# Patient Record
Sex: Female | Born: 1970 | Race: White | Hispanic: No | Marital: Married | Smoking: Former smoker
Health system: Southern US, Community
[De-identification: ages and names within clinical notes are randomized; demographics above are authoritative.]

## PROBLEM LIST (undated history)

## (undated) DIAGNOSIS — F419 Anxiety disorder, unspecified: Secondary | ICD-10-CM

## (undated) DIAGNOSIS — E119 Type 2 diabetes mellitus without complications: Secondary | ICD-10-CM

---

## 2006-06-20 ENCOUNTER — Emergency Department (HOSPITAL_COMMUNITY): Admission: EM | Admit: 2006-06-20 | Discharge: 2006-06-20 | Payer: Self-pay | Admitting: Emergency Medicine

## 2007-01-01 ENCOUNTER — Emergency Department (HOSPITAL_COMMUNITY): Admission: EM | Admit: 2007-01-01 | Discharge: 2007-01-01 | Payer: Self-pay | Admitting: Emergency Medicine

## 2008-07-15 ENCOUNTER — Emergency Department (HOSPITAL_COMMUNITY): Admission: EM | Admit: 2008-07-15 | Discharge: 2008-07-15 | Payer: Self-pay | Admitting: Emergency Medicine

## 2009-08-24 ENCOUNTER — Emergency Department (HOSPITAL_COMMUNITY): Admission: EM | Admit: 2009-08-24 | Discharge: 2009-08-24 | Payer: Self-pay | Admitting: Emergency Medicine

## 2009-08-29 ENCOUNTER — Ambulatory Visit (HOSPITAL_COMMUNITY): Admission: RE | Admit: 2009-08-29 | Discharge: 2009-08-29 | Payer: Self-pay | Admitting: Pediatrics

## 2010-06-28 ENCOUNTER — Emergency Department (HOSPITAL_COMMUNITY): Admission: EM | Admit: 2010-06-28 | Discharge: 2010-06-28 | Payer: Self-pay | Admitting: Emergency Medicine

## 2010-07-10 ENCOUNTER — Ambulatory Visit (HOSPITAL_COMMUNITY): Admission: RE | Admit: 2010-07-10 | Discharge: 2010-07-10 | Payer: Self-pay | Admitting: Orthopaedic Surgery

## 2010-07-10 ENCOUNTER — Encounter (HOSPITAL_COMMUNITY)
Admission: RE | Admit: 2010-07-10 | Discharge: 2010-08-09 | Payer: Self-pay | Source: Home / Self Care | Admitting: Orthopaedic Surgery

## 2011-07-24 ENCOUNTER — Encounter: Payer: Self-pay | Admitting: *Deleted

## 2011-07-24 ENCOUNTER — Emergency Department (HOSPITAL_COMMUNITY)
Admission: EM | Admit: 2011-07-24 | Discharge: 2011-07-24 | Disposition: A | Discharge status: Home / Self Care | Payer: Managed Care, Other (non HMO) | Attending: Emergency Medicine | Admitting: Emergency Medicine

## 2011-07-24 DIAGNOSIS — R0602 Shortness of breath: Secondary | ICD-10-CM | POA: Insufficient documentation

## 2011-07-24 DIAGNOSIS — R232 Flushing: Secondary | ICD-10-CM | POA: Insufficient documentation

## 2011-07-24 DIAGNOSIS — M549 Dorsalgia, unspecified: Secondary | ICD-10-CM | POA: Insufficient documentation

## 2011-07-24 DIAGNOSIS — F17200 Nicotine dependence, unspecified, uncomplicated: Secondary | ICD-10-CM | POA: Insufficient documentation

## 2011-07-24 DIAGNOSIS — R000 Tachycardia, unspecified: Secondary | ICD-10-CM | POA: Insufficient documentation

## 2011-07-24 LAB — URINALYSIS, ROUTINE W REFLEX MICROSCOPIC
Hgb urine dipstick: NEGATIVE
Protein, ur: NEGATIVE mg/dL
Specific Gravity, Urine: 1.025 (ref 1.005–1.030)
Urobilinogen, UA: 0.2 mg/dL (ref 0.0–1.0)

## 2011-07-24 MED ORDER — HYDROCODONE-ACETAMINOPHEN 7.5-325 MG PO TABS: 1.0000 | ORAL_TABLET | ORAL | Status: AC | PRN

## 2011-07-24 MED ORDER — HYDROCODONE-ACETAMINOPHEN 5-325 MG PO TABS
2.0000 | ORAL_TABLET | Freq: Once | ORAL | Status: AC
  Administered 2011-07-24: 2 via ORAL
  Filled 2011-07-24: qty 2

## 2011-07-24 MED ORDER — CYCLOBENZAPRINE HCL 10 MG PO TABS: ORAL_TABLET | ORAL | Status: DC

## 2011-07-24 MED ORDER — DEXAMETHASONE 6 MG PO TABS: ORAL_TABLET | ORAL | Status: AC

## 2011-07-24 MED ORDER — ONDANSETRON HCL 4 MG PO TABS
4.0000 mg | ORAL_TABLET | Freq: Once | ORAL | Status: AC
  Administered 2011-07-24: 4 mg via ORAL
  Filled 2011-07-24: qty 1

## 2011-07-24 MED ORDER — DIAZEPAM 5 MG PO TABS
10.0000 mg | ORAL_TABLET | Freq: Once | ORAL | Status: AC
  Administered 2011-07-24: 10 mg via ORAL
  Filled 2011-07-24: qty 2

## 2011-07-24 NOTE — ED Provider Notes (Signed)
Medical screening examination/treatment/procedure(s) were performed by non-physician practitioner and as supervising physician I was immediately available for consultation/collaboration.  Andjela Wickes R. Tarance Balan, MD 07/24/11 2356 

## 2011-07-24 NOTE — ED Provider Notes (Signed)
History     CSN: [id number] Arrival date & time: 07/24/2011  9:20 PM   First MD Initiated Contact with Patient 07/24/11 2130      Chief Complaint  Patient presents with  . Allergic Reaction    (Consider location/radiation/quality/duration/timing/severity/associated sxs/prior treatment) HPI Comments: Pt states she developed pain in the back going to the left hip/thigh and pubis. She felt she was having a UTI and was seen by her MD. UA was "clean", but pt was placed on macrodantin. After one pill pt states she felt short of breath and that her heart was racing. No rash. She did feel flushed. No swelling of face, tongue or lips, or hands or feet.  Patient is a 40 y.o. female presenting with allergic reaction. The history is provided by the patient.  Allergic Reaction The primary symptoms are  shortness of breath and palpitations. The primary symptoms do not include wheezing, cough, abdominal pain, nausea, vomiting, diarrhea, dizziness, rash, angioedema or urticaria. The current episode started 13 to 24 hours ago. The problem has not changed since onset. The shortness of breath began today. The shortness of breath developed suddenly. The patient's medical history does not include CHF, COPD, asthma or chronic lung disease.  The palpitations also occurred with shortness of breath. The palpitations did not occur with dizziness.   The onset of the reaction was associated with a new medication. Significant symptoms also include flushing. Significant symptoms that are not present include rhinorrhea or itching.    History reviewed. No pertinent past medical history.  Past Surgical History  Procedure Date  . Cesarean section     x 2    No family history on file.  History  Substance Use Topics  . Smoking status: Current Everyday Smoker    Types: Cigarettes  . Smokeless tobacco: Not on file  . Alcohol Use: No    OB History    Grav Para Term Preterm Abortions TAB SAB Ect Mult Living                 Review of Systems  HENT: Negative for rhinorrhea.   Respiratory: Positive for shortness of breath. Negative for cough and wheezing.   Cardiovascular: Positive for palpitations.  Gastrointestinal: Negative for nausea, vomiting, abdominal pain and diarrhea.  Skin: Positive for flushing. Negative for itching and rash.  Neurological: Negative for dizziness.    Allergies  Shellfish allergy and Aspartame and phenylalanine  Home Medications   Current Outpatient Rx  Name Route Sig Dispense Refill  . NITROFURANTOIN MACROCRYSTAL 100 MG PO CAPS Oral Take 100 mg by mouth 2 (two) times daily.        BP 111/68  Pulse 77  Temp(Src) 98.2 F (36.8 C) (Oral)  Resp 16  Ht 5\' 2"  (1.575 m)  Wt 152 lb (68.947 kg)  BMI 27.80 kg/m2  SpO2 99%  Physical Exam  Nursing note and vitals reviewed. Constitutional: She is oriented to person, place, and time. She appears well-developed and well-nourished.  Non-toxic appearance.  HENT:  Head: Normocephalic.  Right Ear: Tympanic membrane and external ear normal.  Left Ear: Tympanic membrane and external ear normal.       No face, lips, or tongue swelling.  Eyes: EOM and lids are normal. Pupils are equal, round, and reactive to light.  Neck: Normal range of motion. Neck supple. Carotid bruit is not present.  Cardiovascular: Normal rate, regular rhythm, normal heart sounds, intact distal pulses and normal pulses.   Pulmonary/Chest: Breath sounds normal. No  respiratory distress.  Abdominal: Soft. Bowel sounds are normal. There is no tenderness. There is no guarding.  Musculoskeletal: Normal range of motion.       Straight leg raise and adduction and abduction of the left hip causes back pain.  Palpable spasm noted on exam.  Lymphadenopathy:       Head (right side): No submandibular adenopathy present.       Head (left side): No submandibular adenopathy present.    She has no cervical adenopathy.  Neurological: She is alert and oriented to  person, place, and time. She has normal strength. No cranial nerve deficit or sensory deficit.  Skin: Skin is warm and dry. No rash noted. No erythema.  Psychiatric: She has a normal mood and affect. Her speech is normal.    ED Course: Test result discussed with pt. Pt to follow up with her MD for evaluation of her back.  Procedures (including critical care time)  Labs Reviewed - No data to display No results found.   Dx: Back pain   MDM  I have reviewed nursing notes, vital signs, and all appropriate lab and imaging results for this patient.        Kathie Dike, [place] 07/24/11 2329

## 2011-07-24 NOTE — ED Notes (Signed)
States went to PCP today and is being treated for kidney infection due to left sided back pain radiating down left leg.  Given rx for Macrobid - states took first dose today and approx 1 hr after taking, pt felt heart racing, became flushed, sleepy/tired, and headache.  Pt states feels like she is sitting on pins and needles right now.  Denies n/v, denies GU sx.

## 2012-03-22 ENCOUNTER — Encounter (HOSPITAL_COMMUNITY): Payer: Self-pay | Admitting: Emergency Medicine

## 2012-03-22 ENCOUNTER — Emergency Department (HOSPITAL_COMMUNITY)
Admission: EM | Admit: 2012-03-22 | Discharge: 2012-03-22 | Disposition: A | Discharge status: Home / Self Care | Payer: Managed Care, Other (non HMO) | Attending: Emergency Medicine | Admitting: Emergency Medicine

## 2012-03-22 ENCOUNTER — Emergency Department (HOSPITAL_COMMUNITY): Payer: Managed Care, Other (non HMO)

## 2012-03-22 DIAGNOSIS — M549 Dorsalgia, unspecified: Secondary | ICD-10-CM | POA: Insufficient documentation

## 2012-03-22 DIAGNOSIS — R071 Chest pain on breathing: Secondary | ICD-10-CM | POA: Insufficient documentation

## 2012-03-22 DIAGNOSIS — M542 Cervicalgia: Secondary | ICD-10-CM | POA: Insufficient documentation

## 2012-03-22 DIAGNOSIS — R0789 Other chest pain: Secondary | ICD-10-CM

## 2012-03-22 DIAGNOSIS — F17200 Nicotine dependence, unspecified, uncomplicated: Secondary | ICD-10-CM | POA: Insufficient documentation

## 2012-03-22 DIAGNOSIS — J069 Acute upper respiratory infection, unspecified: Secondary | ICD-10-CM | POA: Insufficient documentation

## 2012-03-22 LAB — DIFFERENTIAL
Basophils Relative: 1 % (ref 0–1)
Eosinophils Absolute: 0.3 10*3/uL (ref 0.0–0.7)
Eosinophils Relative: 4 % (ref 0–5)
Monocytes Absolute: 0.4 10*3/uL (ref 0.1–1.0)
Monocytes Relative: 5 % (ref 3–12)
Neutrophils Relative %: 49 % (ref 43–77)

## 2012-03-22 LAB — CBC
Hemoglobin: 13.7 g/dL (ref 12.0–15.0)
MCH: 32.2 pg (ref 26.0–34.0)
MCHC: 34.2 g/dL (ref 30.0–36.0)
MCV: 94.4 fL (ref 78.0–100.0)

## 2012-03-22 LAB — BASIC METABOLIC PANEL
BUN: 16 mg/dL (ref 6–23)
GFR calc non Af Amer: >90 mL/min (ref >90)
Glucose, Bld: 84 mg/dL (ref 70–99)
Potassium: 3.9 mEq/L (ref 3.5–5.1)

## 2012-03-22 LAB — URINALYSIS, ROUTINE W REFLEX MICROSCOPIC
Bilirubin Urine: NEGATIVE
Glucose, UA: NEGATIVE mg/dL
Hgb urine dipstick: NEGATIVE
Ketones, ur: NEGATIVE mg/dL
Protein, ur: NEGATIVE mg/dL

## 2012-03-22 MED ORDER — METOCLOPRAMIDE HCL 5 MG/ML IJ SOLN
10.0000 mg | Freq: Once | INTRAMUSCULAR | Status: AC
  Administered 2012-03-22: 10 mg via INTRAVENOUS
  Filled 2012-03-22: qty 2

## 2012-03-22 MED ORDER — MORPHINE SULFATE 4 MG/ML IJ SOLN
6.0000 mg | Freq: Once | INTRAMUSCULAR | Status: AC
  Administered 2012-03-22: 6 mg via INTRAVENOUS
  Filled 2012-03-22: qty 2

## 2012-03-22 NOTE — Discharge Instructions (Signed)
Your caregiver has diagnosed you as having chest pain that is not specific for one problem, but does not require admission.  You are at low risk for an acute heart condition or other serious illness. Chest pain comes from many different causes.  SEEK IMMEDIATE MEDICAL ATTENTION IF: You have severe chest pain, especially if the pain is crushing or pressure-like and spreads to the arms, back, neck, or jaw, or if you have sweating, nausea (feeling sick to your stomach), or shortness of breath. THIS IS AN EMERGENCY. Don't wait to see if the pain will go away. Get medical help at once. Call 911 or 0 (operator). DO NOT drive yourself to the hospital.  Your chest pain gets worse and does not go away with rest.  You have an attack of chest pain lasting longer than usual, despite rest and treatment with the medications your caregiver has prescribed.  You wake from sleep with chest pain or shortness of breath.  You feel dizzy or faint.  You have chest pain not typical of your usual pain for which you originally saw your caregiver.  You have been diagnosed by your caregiver as having chest wall pain. SEEK IMMEDIATE MEDICAL ATTENTION IF: You develop a fever.  Your chest pains become severe or intolerable.  You develop new, unexplained symptoms (problems).  You develop shortness of breath, nausea, vomiting, sweating or feel light headed.  You develop a new cough or you cough up blood.  You appear to have an upper respiratory infection (URI). An upper respiratory tract infection, or cold, is a viral infection of the air passages leading to the lungs. It is contagious and can be spread to others, especially during the first 3 or 4 days. It cannot be cured by antibiotics or other medicines. RETURN IMMEDIATELY IF you develop shortness of breath, confusion or altered mental status, a new rash, become dizzy, faint, or poorly responsive, or are unable to be cared for at home.  You are having a headache. No specific  cause was found today for your headache. It may have been a migraine or other cause of headache. Stress, anxiety, fatigue, and depression are common triggers for headaches. Your headache today does not appear to be life-threatening or require hospitalization, but often the exact cause of headaches is not determined in the emergency department. Therefore, follow-up with your doctor is very important to find out what may have caused your headache, and whether or not you need any further diagnostic testing or treatment. Sometimes headaches can appear benign (not harmful), but then more serious symptoms can develop which should prompt an immediate re-evaluation by your doctor or the emergency department. SEEK MEDICAL ATTENTION IF: You develop possible problems with medications prescribed.  The medications don't resolve your headache, if it recurs , or if you have multiple episodes of vomiting or can't take fluids. You have a change from the usual headache. RETURN IMMEDIATELY IF you develop a sudden, severe headache or confusion, become poorly responsive or faint, develop a fever above 100.63F or problem breathing, have a change in speech, vision, swallowing, or understanding, or develop new weakness, numbness, tingling, incoordination, or have a seizure.

## 2012-03-22 NOTE — ED Notes (Signed)
Talking with family members -   Monitor NSR to sinus brady without ectopy

## 2012-03-22 NOTE — ED Provider Notes (Signed)
History   Scribed for No att. providers found, the patient was seen in [facility]. The chart was scribed by Gilman Schmidt. The patients care was started at 1:26 AM.   CSN: [id number]  Arrival date & time 03/22/12  1928   First MD Initiated Contact with Patient 03/22/12 2007      Chief Complaint  Patient presents with  . Chest Pain    (Consider location/radiation/quality/duration/timing/severity/associated sxs/prior treatment) HPI PROMISE Terri Franklin is a 41 y.o. female who presents to the Emergency Department complaining of gradual onset (early afternoon) constant wax and waning chest wall pain radiating to neck, back, and arm. States that left side of chest feels sore as if she had been "punched in chest". Also notes mild pressure like headache. Symptoms are exacerbated upon walking. Pt notes past hx of DM that was controlled with diet and weight loss. Reports nausea. Also reports hx of cough and congestion several days. No recent travel or trauma. There are no other associated symptoms and no other alleviating or aggravating factors.   History reviewed. No pertinent past medical history.  Past Surgical History  Procedure Date  . Cesarean section     x 2    No family history on file.  History  Substance Use Topics  . Smoking status: Current Everyday Smoker    Types: Cigarettes  . Smokeless tobacco: Not on file  . Alcohol Use: No    OB History    Grav Para Term Preterm Abortions TAB SAB Ect Mult Living                  Review of Systems  Constitutional: Negative for fever.       10 Systems reviewed and are negative for acute change except as noted in the HPI.  HENT: Positive for congestion. Negative for rhinorrhea.   Eyes: Negative for discharge and redness.  Respiratory: Positive for cough. Negative for shortness of breath.   Cardiovascular: Positive for chest pain.  Gastrointestinal: Negative for vomiting, abdominal pain and diarrhea.  Genitourinary: Negative for  dysuria.  Musculoskeletal: Negative for back pain.  Skin: Negative for rash.  Neurological: Positive for headaches. Negative for dizziness, syncope, speech difficulty, weakness and numbness.  Psychiatric/Behavioral: Negative for suicidal ideas, hallucinations and confusion.  All other systems reviewed and are negative.    Allergies  Shellfish allergy and Aspartame and phenylalanine  Home Medications   Current Outpatient Rx  Name Route Sig Dispense Refill  . IBUPROFEN 200 MG PO TABS Oral Take 200 mg by mouth every 6 (six) hours as needed.    . CYCLOBENZAPRINE HCL 10 MG PO TABS  1 po tid for spasm 21 tablet 0  . NITROFURANTOIN MACROCRYSTAL 100 MG PO CAPS Oral Take 100 mg by mouth 2 (two) times daily.        BP 93/51  Pulse 56  Temp 98.7 F (37.1 C) (Oral)  Resp 20  Ht 5\' 1"  (1.549 m)  Wt 152 lb (68.947 kg)  BMI 28.72 kg/m2  SpO2 95%  Physical Exam  Nursing note and vitals reviewed. Constitutional:       Awake, alert, nontoxic appearance with baseline speech for patient.  HENT:  Head: Atraumatic.  Mouth/Throat: No oropharyngeal exudate.  Eyes: EOM are normal. Pupils are equal, round, and reactive to light. Right eye exhibits no discharge. Left eye exhibits no discharge.  Neck: Neck supple.  Cardiovascular: Normal rate and regular rhythm.   No murmur heard. Pulmonary/Chest: Effort normal and breath sounds normal. No  stridor. No respiratory distress. She has no wheezes. She has no rales. She exhibits no tenderness.       Chest wall soreness   Abdominal: Soft. Bowel sounds are normal. She exhibits no mass. There is no tenderness. There is no rebound.  Musculoskeletal: She exhibits no tenderness.       Baseline ROM, moves extremities with no obvious new focal weakness. Tenderness to left trapezius and left armback  Lymphadenopathy:    She has no cervical adenopathy.  Neurological:       Awake, alert, cooperative and aware of situation; motor strength bilaterally; sensation  normal to light touch bilaterally; peripheral visual fields full to confrontation; no facial asymmetry; tongue midline; major cranial nerves appear intact; no pronator drift, normal finger to nose bilaterally, baseline gait without new ataxia.  Skin: No rash noted.  Psychiatric: She has a normal mood and affect.    ED Course  Procedures (including critical care time) ECG: Normal sinus rhythm, such rate 66, normal axis, normal intervals, impression normal ECG, no acute ischemic changes noted, no comparison ECG available Labs Reviewed  URINALYSIS, ROUTINE W REFLEX MICROSCOPIC - Abnormal; Notable for the following:    Specific Gravity, Urine <1.005 (*)     All other components within normal limits  BASIC METABOLIC PANEL  CBC  DIFFERENTIAL  PREGNANCY, URINE  POCT I-STAT TROPONIN I  LAB REPORT - SCANNED   No results found.   1. Chest wall pain   2. Upper respiratory infection     DIAGNOSTIC STUDIES: Oxygen Saturation is 95% on room air, adequate by my interpretation.    LABS Results for orders placed during the hospital encounter of 03/22/12  BASIC METABOLIC PANEL      Component Value Range   Sodium 139  135 - 145 mEq/L   Potassium 3.9  3.5 - 5.1 mEq/L   Chloride 107  96 - 112 mEq/L   CO2 24  19 - 32 mEq/L   Glucose, Bld 84  70 - 99 mg/dL   BUN 16  6 - 23 mg/dL   Creatinine, Ser 1.61  0.50 - 1.10 mg/dL   Calcium 9.4  8.4 - 09.6 mg/dL   GFR calc non Af Amer >90  >90 mL/min   GFR calc Af Amer >90  >90 mL/min  CBC      Component Value Range   WBC 7.6  4.0 - 10.5 K/uL   RBC 4.25  3.87 - 5.11 MIL/uL   Hemoglobin 13.7  12.0 - 15.0 g/dL   HCT 04.5  40.9 - 81.1 %   MCV 94.4  78.0 - 100.0 fL   MCH 32.2  26.0 - 34.0 pg   MCHC 34.2  30.0 - 36.0 g/dL   RDW 91.4  78.2 - 95.6 %   Platelets 195  150 - 400 K/uL  DIFFERENTIAL      Component Value Range   Neutrophils Relative 49  43 - 77 %   Neutro Abs 3.7  1.7 - 7.7 K/uL   Lymphocytes Relative 41  12 - 46 %   Lymphs Abs 3.1  0.7  - 4.0 K/uL   Monocytes Relative 5  3 - 12 %   Monocytes Absolute 0.4  0.1 - 1.0 K/uL   Eosinophils Relative 4  0 - 5 %   Eosinophils Absolute 0.3  0.0 - 0.7 K/uL   Basophils Relative 1  0 - 1 %   Basophils Absolute 0.0  0.0 - 0.1 K/uL  PREGNANCY, URINE  Component Value Range   Preg Test, Ur NEGATIVE  NEGATIVE  URINALYSIS, ROUTINE W REFLEX MICROSCOPIC      Component Value Range   Color, Urine YELLOW  YELLOW   APPearance CLEAR  CLEAR   Specific Gravity, Urine <1.005 (*) 1.005 - 1.030   pH 6.0  5.0 - 8.0   Glucose, UA NEGATIVE  NEGATIVE mg/dL   Hgb urine dipstick NEGATIVE  NEGATIVE   Bilirubin Urine NEGATIVE  NEGATIVE   Ketones, ur NEGATIVE  NEGATIVE mg/dL   Protein, ur NEGATIVE  NEGATIVE mg/dL   Urobilinogen, UA 0.2  0.0 - 1.0 mg/dL   Nitrite NEGATIVE  NEGATIVE   Leukocytes, UA NEGATIVE  NEGATIVE  POCT I-STAT TROPONIN I      Component Value Range   Troponin i, poc 0.00  0.00 - 0.08 ng/mL   Comment 3            Radiology: DG Chest 2 View. Reviewed by me. IMPRESSION: Negative, no acute cardiopulmonary abnormality.  Original Report Authenticated By: Harley Hallmark, M.D.         COORDINATION OF CARE: 8:09pm:  - Patient evaluated by ED physician, EKG, morphine, Reglan, DG Chest, POCT, BMP, CBC, Diff, Pregnancy urine, UA  ordered     MDM  I personally performed the services described in this documentation, which was scribed in my presence. The recorded information has been reviewed and considered. Pt stable in ED with no significant deterioration in condition.Patient / Family / Caregiver informed of clinical course, understand medical decision-making process, and agree with plan.       Hurman Horn, MD 03/26/12 [phone]

## 2012-03-22 NOTE — ED Notes (Signed)
Patient states she is under a "lot of stress"  With family situations.

## 2012-08-08 ENCOUNTER — Emergency Department (HOSPITAL_COMMUNITY)
Admission: EM | Admit: 2012-08-08 | Discharge: 2012-08-09 | Disposition: A | Discharge status: Home / Self Care | Payer: Managed Care, Other (non HMO) | Attending: Emergency Medicine | Admitting: Emergency Medicine

## 2012-08-08 ENCOUNTER — Encounter (HOSPITAL_COMMUNITY): Payer: Self-pay | Admitting: Emergency Medicine

## 2012-08-08 DIAGNOSIS — M545 Low back pain, unspecified: Secondary | ICD-10-CM | POA: Insufficient documentation

## 2012-08-08 DIAGNOSIS — N9489 Other specified conditions associated with female genital organs and menstrual cycle: Secondary | ICD-10-CM | POA: Insufficient documentation

## 2012-08-08 DIAGNOSIS — Z9851 Tubal ligation status: Secondary | ICD-10-CM | POA: Insufficient documentation

## 2012-08-08 DIAGNOSIS — Z79899 Other long term (current) drug therapy: Secondary | ICD-10-CM | POA: Insufficient documentation

## 2012-08-08 DIAGNOSIS — N760 Acute vaginitis: Secondary | ICD-10-CM | POA: Insufficient documentation

## 2012-08-08 DIAGNOSIS — N898 Other specified noninflammatory disorders of vagina: Secondary | ICD-10-CM | POA: Insufficient documentation

## 2012-08-08 DIAGNOSIS — F17200 Nicotine dependence, unspecified, uncomplicated: Secondary | ICD-10-CM | POA: Insufficient documentation

## 2012-08-08 DIAGNOSIS — Z8742 Personal history of other diseases of the female genital tract: Secondary | ICD-10-CM | POA: Insufficient documentation

## 2012-08-08 DIAGNOSIS — A499 Bacterial infection, unspecified: Secondary | ICD-10-CM | POA: Insufficient documentation

## 2012-08-08 DIAGNOSIS — Z87440 Personal history of urinary (tract) infections: Secondary | ICD-10-CM | POA: Insufficient documentation

## 2012-08-08 DIAGNOSIS — B9689 Other specified bacterial agents as the cause of diseases classified elsewhere: Secondary | ICD-10-CM | POA: Insufficient documentation

## 2012-08-08 LAB — URINALYSIS, ROUTINE W REFLEX MICROSCOPIC
Bilirubin Urine: NEGATIVE
Ketones, ur: NEGATIVE mg/dL
Nitrite: NEGATIVE
Specific Gravity, Urine: 1.02 (ref 1.005–1.030)
Urobilinogen, UA: 0.2 mg/dL (ref 0.0–1.0)

## 2012-08-08 MED ORDER — METRONIDAZOLE 500 MG PO TABS: 500.0000 mg | ORAL_TABLET | Freq: Two times a day (BID) | ORAL | Status: DC

## 2012-08-08 MED ORDER — METRONIDAZOLE 500 MG PO TABS
500.0000 mg | ORAL_TABLET | Freq: Once | ORAL | Status: AC
  Administered 2012-08-09: 500 mg via ORAL
  Filled 2012-08-08: qty 1

## 2012-08-08 NOTE — ED Notes (Signed)
Pt c/o lower left sided back pain radiating around to front llq. Pt c/o vaginal pain from vagina to rectum. Pt states "I feel like I am on fire."

## 2012-08-08 NOTE — ED Provider Notes (Signed)
History   This chart was scribed for Terri Lyons, MD, by Marcina Millard scribe. The patient was seen in room [facility] and the patient's care was started at 2139.    CSN: [id number]  Arrival date & time 08/08/12  2130   First MD Initiated Contact with Patient 08/08/12 2139      Chief Complaint  Patient presents with  . Vaginal Itching  . Vaginal Pain  . Back Pain    (Consider location/radiation/quality/duration/timing/severity/associated sxs/prior treatment) HPI Comments: Terri Franklin is a 41 y.o. female with a h/o of UTI who presents to the Emergency Department complaining of constant, moderate vaginal pain, discharge, and itching that radiates to her rectum that began PTA. She reports associated constant, moderate lower back pain that radiates to her left flank. She denies any constipation, dysuria, or difficulty urinating. She states that she is sexually active with one long term sexual partner. She reports that her last monthly period was in Feb. 2012. She states that she had a tubal ligation in 1998, an ovarian cyst removed in  2009 or 2009. She denies any other existing medical conditions.      History reviewed. No pertinent past medical history.  Past Surgical History  Procedure Date  . Cesarean section     x 2    History reviewed. No pertinent family history.  History  Substance Use Topics  . Smoking status: Current Every Day Smoker    Types: Cigarettes  . Smokeless tobacco: Not on file  . Alcohol Use: No    OB History    Grav Para Term Preterm Abortions TAB SAB Ect Mult Living                  Review of Systems A complete 10 system review of systems was obtained and all systems are negative except as noted in the HPI and PMH.   Allergies  Shellfish allergy and Aspartame and phenylalanine  Home Medications   Current Outpatient Rx  Name  Route  Sig  Dispense  Refill  . CYCLOBENZAPRINE HCL 10 MG PO TABS      1 po tid for spasm   21 tablet    0   . IBUPROFEN 200 MG PO TABS   Oral   Take 200 mg by mouth every 6 (six) hours as needed.         Marland Kitchen NITROFURANTOIN MACROCRYSTAL 100 MG PO CAPS   Oral   Take 100 mg by mouth 2 (two) times daily.             BP 125/75  Pulse 77  Temp 97.6 F (36.4 C)  Resp 20  Ht 5\' 1"  (1.549 m)  Wt 157 lb (71.215 kg)  BMI 29.67 kg/m2  SpO2 100%  LMP 11/08/2010  Physical Exam  Nursing note and vitals reviewed. Constitutional: She appears well-developed and well-nourished.  HENT:  Head: Normocephalic and atraumatic.  Eyes: Conjunctivae normal and EOM are normal. Pupils are equal, round, and reactive to light.  Neck: Normal range of motion. Neck supple.  Cardiovascular: Normal rate, regular rhythm, normal heart sounds and intact distal pulses.   Pulmonary/Chest: Effort normal and breath sounds normal.  Abdominal: Soft. Bowel sounds are normal.  Genitourinary:       Her vagina and uterus appear normal. There is slight gray discharge. Just inferior to the vaginal orfice, on the left there is a small pea-sized cyst. There is no erythema present.   Musculoskeletal: Normal range of motion.  Neurological:  She is alert.  Skin: Skin is warm and dry.  Psychiatric: She has a normal mood and affect. Thought content normal.    ED Course  Procedures (including critical care time)  DIAGNOSTIC STUDIES: Oxygen Saturation is 100% on room air, normal by my interpretation.    COORDINATION OF CARE:  21:55- Discussed planned course of treatment with the patient, including a UA, who is agreeable at this time.     Labs Reviewed  URINALYSIS, ROUTINE W REFLEX MICROSCOPIC   No results found.   No diagnosis found.    MDM  The wet prep shows moderate clue cells and the history and exam are potentially consistent with BV.  Will treat with flagyl awaiting gc/chlamydia cultures.       I personally performed the services described in this documentation, which was scribed in my presence. The  recorded information has been reviewed and is accurate.          Terri Lyons, MD 08/08/12 [phone]

## 2012-08-11 LAB — GC/CHLAMYDIA PROBE AMP, GENITAL: Chlamydia, DNA Probe: NEGATIVE

## 2014-04-18 ENCOUNTER — Encounter (HOSPITAL_COMMUNITY): Payer: Self-pay | Admitting: Emergency Medicine

## 2014-04-18 ENCOUNTER — Emergency Department (HOSPITAL_COMMUNITY): Payer: BC Managed Care – PPO

## 2014-04-18 ENCOUNTER — Emergency Department (HOSPITAL_COMMUNITY)
Admission: EM | Admit: 2014-04-18 | Discharge: 2014-04-18 | Disposition: A | Discharge status: Home / Self Care | Payer: BC Managed Care – PPO | Attending: Emergency Medicine | Admitting: Emergency Medicine

## 2014-04-18 DIAGNOSIS — R109 Unspecified abdominal pain: Secondary | ICD-10-CM | POA: Insufficient documentation

## 2014-04-18 DIAGNOSIS — Z791 Long term (current) use of non-steroidal anti-inflammatories (NSAID): Secondary | ICD-10-CM | POA: Insufficient documentation

## 2014-04-18 DIAGNOSIS — Z3202 Encounter for pregnancy test, result negative: Secondary | ICD-10-CM | POA: Insufficient documentation

## 2014-04-18 DIAGNOSIS — F17200 Nicotine dependence, unspecified, uncomplicated: Secondary | ICD-10-CM | POA: Insufficient documentation

## 2014-04-18 DIAGNOSIS — Z79899 Other long term (current) drug therapy: Secondary | ICD-10-CM | POA: Insufficient documentation

## 2014-04-18 LAB — COMPREHENSIVE METABOLIC PANEL
ALT: 15 U/L (ref 0–35)
AST: 16 U/L (ref 0–37)
Albumin: 4.6 g/dL (ref 3.5–5.2)
Alkaline Phosphatase: 78 U/L (ref 39–117)
Anion gap: 10 (ref 5–15)
BUN: 15 mg/dL (ref 6–23)
CALCIUM: 9.9 mg/dL (ref 8.4–10.5)
CO2: 28 meq/L (ref 19–32)
CREATININE: 0.75 mg/dL (ref 0.50–1.10)
Chloride: 103 mEq/L (ref 96–112)
Glucose, Bld: 97 mg/dL (ref 70–99)
Potassium: 4.5 mEq/L (ref 3.7–5.3)
SODIUM: 141 meq/L (ref 137–147)
TOTAL PROTEIN: 7.5 g/dL (ref 6.0–8.3)
Total Bilirubin: 0.4 mg/dL (ref 0.3–1.2)

## 2014-04-18 LAB — WET PREP, GENITAL
Clue Cells Wet Prep HPF POC: NONE SEEN
TRICH WET PREP: NONE SEEN
YEAST WET PREP: NONE SEEN

## 2014-04-18 LAB — CBC WITH DIFFERENTIAL/PLATELET
BASOS ABS: 0 10*3/uL (ref 0.0–0.1)
Basophils Relative: 0 % (ref 0–1)
EOS ABS: 0.3 10*3/uL (ref 0.0–0.7)
EOS PCT: 4 % (ref 0–5)
HEMATOCRIT: 44 % (ref 36.0–46.0)
Hemoglobin: 15 g/dL (ref 12.0–15.0)
LYMPHS PCT: 40 % (ref 12–46)
Lymphs Abs: 3.3 10*3/uL (ref 0.7–4.0)
MCH: 32.5 pg (ref 26.0–34.0)
MCHC: 34.1 g/dL (ref 30.0–36.0)
MCV: 95.2 fL (ref 78.0–100.0)
MONO ABS: 0.4 10*3/uL (ref 0.1–1.0)
Monocytes Relative: 5 % (ref 3–12)
Neutro Abs: 4.3 10*3/uL (ref 1.7–7.7)
Neutrophils Relative %: 51 % (ref 43–77)
PLATELETS: 201 10*3/uL (ref 150–400)
RBC: 4.62 MIL/uL (ref 3.87–5.11)
RDW: 12.7 % (ref 11.5–15.5)
WBC: 8.4 10*3/uL (ref 4.0–10.5)

## 2014-04-18 LAB — URINALYSIS, ROUTINE W REFLEX MICROSCOPIC
BILIRUBIN URINE: NEGATIVE
GLUCOSE, UA: NEGATIVE mg/dL
HGB URINE DIPSTICK: NEGATIVE
KETONES UR: NEGATIVE mg/dL
Leukocytes, UA: NEGATIVE
Nitrite: NEGATIVE
PROTEIN: NEGATIVE mg/dL
Specific Gravity, Urine: <1.005 — ABNORMAL LOW (ref 1.005–1.030)
UROBILINOGEN UA: 0.2 mg/dL (ref 0.0–1.0)
pH: 6 (ref 5.0–8.0)

## 2014-04-18 LAB — PREGNANCY, URINE: PREG TEST UR: NEGATIVE

## 2014-04-18 MED ORDER — KETOROLAC TROMETHAMINE 30 MG/ML IJ SOLN
30.0000 mg | Freq: Once | INTRAMUSCULAR | Status: AC
  Administered 2014-04-18: 30 mg via INTRAVENOUS
  Filled 2014-04-18: qty 1

## 2014-04-18 MED ORDER — MORPHINE SULFATE 4 MG/ML IJ SOLN
4.0000 mg | Freq: Once | INTRAMUSCULAR | Status: AC
  Administered 2014-04-18: 4 mg via INTRAVENOUS
  Filled 2014-04-18: qty 1

## 2014-04-18 MED ORDER — ONDANSETRON HCL 4 MG/2ML IJ SOLN
4.0000 mg | Freq: Once | INTRAMUSCULAR | Status: AC
Start: 2014-04-18 — End: 2014-04-18
  Administered 2014-04-18: 4 mg via INTRAVENOUS
  Filled 2014-04-18: qty 2

## 2014-04-18 NOTE — ED Provider Notes (Signed)
CSN: [id number]     Arrival date & time 04/18/14  1143 History   This chart was scribed for Terri Essex, MD by Peyton Bottoms, ED Scribe. This patient was seen in room APA08/APA08 and the patient's care was started at 3:26 PM.   Chief Complaint  Patient presents with  . Flank Pain  The history is provided by the patient. No language interpreter was used.    HPI Comments: Terri Franklin is a 43 y.o. female who presents to the Emergency Department complaining of intermittent left flank pain radiating to left groin and abdomen for the past 2 weeks. Last episode of pain occurred this morning at 11am. Pain worsens when sitting and standing. Patient has not had any prior history of back pain, kidney stones,chest pain, heart problems, lung problems.  Patient states she feels nauseated.  Patient states a pressure sensation during urination. Patient has no hx of vaginal bleeding/dishcarge. LNMP was in February 2012. Patient has prior history of 2 c-sections. Patient is allergic to shellfish and Aspartame.  History reviewed. No pertinent past medical history. Past Surgical History  Procedure Laterality Date  . Cesarean section      x 2   No family history on file. History  Substance Use Topics  . Smoking status: Current Every Day Smoker -- 0.50 packs/day    Types: Cigarettes  . Smokeless tobacco: Not on file  . Alcohol Use: No   OB History   Grav Para Term Preterm Abortions TAB SAB Ect Mult Living                 Review of Systems A complete 10 system review of systems was obtained and all systems are negative except as noted in the HPI and PMH.   Allergies  Shellfish allergy and Aspartame and phenylalanine  Home Medications   Prior to Admission medications   Medication Sig Start Date End Date Taking? Authorizing Provider  HYDROcodone-acetaminophen (NORCO/VICODIN) 5-325 MG per tablet Take 1 tablet by mouth 3 (three) times daily as needed for moderate pain.   Yes Historical  Provider, MD  naproxen sodium (ALEVE) 220 MG tablet Take 220 mg by mouth 2 (two) times daily as needed (for pain).   Yes Historical Provider, MD  ibuprofen (ADVIL,MOTRIN) 200 MG tablet Take 200 mg by mouth every 6 (six) hours as needed. pain    Historical Provider, MD  tamsulosin (FLOMAX) 0.4 MG CAPS capsule Take 0.4 mg by mouth daily.    Historical Provider, MD   BP 142/76  Pulse 76  Resp 18  SpO2 96%  Physical Exam  Nursing note and vitals reviewed. Constitutional: She is oriented to person, place, and time. She appears well-developed and well-nourished.  Pt. Feels uncomfortable  HENT:  Head: Normocephalic and atraumatic.  Mouth/Throat: Oropharynx is clear and moist. No oropharyngeal exudate.  Eyes: Conjunctivae and EOM are normal. Pupils are equal, round, and reactive to light.  Neck: Normal range of motion. Neck supple.  No meningismus.  Cardiovascular: Normal rate, regular rhythm, normal heart sounds and intact distal pulses.   No murmur heard. Pulmonary/Chest: Effort normal and breath sounds normal. No respiratory distress.  Abdominal: Soft. There is no tenderness. There is no rebound and no guarding.  Left lower quadrant abdominal pain. No guarding or rebound  Genitourinary:  Left side CVA tenderness Normal external genitalia. Chaperone present. No discharge. No CMT. No adnexal pain  Musculoskeletal: Normal range of motion. She exhibits no edema and no tenderness.  5/5 strength in bilateral  lower extremities. Ankle plantar and dorsiflexion intact. Great toe extension intact bilaterally. +2 DP and PT pulses. +2 patellar reflexes bilaterally. Normal gait.  Neurological: She is alert and oriented to person, place, and time. No cranial nerve deficit. She exhibits normal muscle tone.  No ataxia on finger to nose bilaterally. No pronator drift. 5/5 strength throughout. CN 2-12 intact. Negative Romberg. Equal grip strength. Sensation intact. Gait is normal.   Skin: Skin is warm. No  rash noted.  Psychiatric: She has a normal mood and affect. Her behavior is normal.    ED Course  Procedures (including critical care time)  DIAGNOSTIC STUDIES: Oxygen Saturation is 96% on room air , normal by my interpretation.    COORDINATION OF CARE: 3:34 PM- Discussed plan to order diagnostic radiology and lab work.  Will also order medications. Pt advised of plan for treatment and pt agrees.   Labs Review Labs Reviewed  WET PREP, GENITAL - Abnormal; Notable for the following:    WBC, Wet Prep HPF POC FEW (*)    All other components within normal limits  URINALYSIS, ROUTINE W REFLEX MICROSCOPIC - Abnormal; Notable for the following:    Specific Gravity, Urine <1.005 (*)    All other components within normal limits  GC/CHLAMYDIA PROBE AMP  CBC WITH DIFFERENTIAL  COMPREHENSIVE METABOLIC PANEL  PREGNANCY, URINE    Imaging Review Ct Abdomen Pelvis Wo Contrast  04/18/2014   CLINICAL DATA:  Left flank pain .  EXAM: CT ABDOMEN AND PELVIS WITHOUT CONTRAST  TECHNIQUE: Multidetector CT imaging of the abdomen and pelvis was performed following the standard protocol without IV contrast.  COMPARISON:  None.  FINDINGS: The liver normal. Spleen normal. Pancreas normal. No biliary distention. Gallbladder is nondistended.  Adrenals normal. Kidneys normal. No hydronephrosis or obstructing ureteral stone. Bladder is nondistended. Uterus and adnexa normal. Tiny amount of free pelvic fluid present.  No significant retroperitoneal adenopathy. Abdominal aorta normal in caliber.  Appendix normal. No bowel distention. No free air. Stomach is nondistended. Tiny mesenteric nonspecific lymph nodes are noted. Mesenteric adenitis cannot be excluded. No hernia.  Mild atelectasis lung bases. Heart size normal. No acute bony abnormality. Tiny sclerotic densities in the pelvis statistically most likely bone islands.  IMPRESSION: Tiny mesenteric lymph nodes, these are nonspecific. Mesenteric adenitis could present  this fashion.   Electronically Signed   By: Marcello Moores  Register   On: 04/18/2014 17:13     EKG Interpretation None      MDM   Final diagnoses:  Flank pain  Left flank pain ongoing for several weeks. Was told that she had the kidney stone at urgent care. He endorses dysuria and frequency. No fever or vomiting.  denies any trauma.  UA is negative. Pelvic exam is benign. Labs unremarkable.  CT scan shows no evidence of kidney stone. She has tiny mesenteric lymph nodes. No hematuria.  unclear source of patient's flank pain. Question recently passed kidney stone. No UTI. No pelvic infection. CT negative. No rash on skin though zoster is considered. Treat symptoms with her norco and motrin that she received from urgent care. Return precautions discussed.  BP 142/76  Pulse 76  Resp 18  SpO2 96%   I personally performed the services described in this documentation, which was scribed in my presence. The recorded information has been reviewed and is accurate.   Terri Essex, MD 04/18/14 2022

## 2014-04-18 NOTE — Discharge Instructions (Signed)
Flank Pain Your CT scan shows no kidney stones and your urine is negative. Take the pain and nausea medications as prescribed. Return to the ED if you develop new or worsening symptoms. Flank pain refers to pain that is located on the side of the body between the upper abdomen and the back. The pain may occur over a short period of time (acute) or may be long-term or reoccurring (chronic). It may be mild or severe. Flank pain can be caused by many things. CAUSES  Some of the more common causes of flank pain include:  Muscle strains.   Muscle spasms.   A disease of your spine (vertebral disk disease).   A lung infection (pneumonia).   Fluid around your lungs (pulmonary edema).   A kidney infection.   Kidney stones.   A very painful skin rash caused by the chickenpox virus (shingles).   Gallbladder disease.  Foss care will depend on the cause of your pain. In general,  Rest as directed by your caregiver.  Drink enough fluids to keep your urine clear or pale yellow.  Only take over-the-counter or prescription medicines as directed by your caregiver. Some medicines may help relieve the pain.  Tell your caregiver about any changes in your pain.  Follow up with your caregiver as directed. SEEK IMMEDIATE MEDICAL CARE IF:   Your pain is not controlled with medicine.   You have new or worsening symptoms.  Your pain increases.   You have abdominal pain.   You have shortness of breath.   You have persistent nausea or vomiting.   You have swelling in your abdomen.   You feel faint or pass out.   You have blood in your urine.  You have a fever or persistent symptoms for more than 2-3 days.  You have a fever and your symptoms suddenly get worse. MAKE SURE YOU:   Understand these instructions.  Will watch your condition.  Will get help right away if you are not doing well or get worse. Document Released: 11/07/2005 Document  Revised: 06/10/2012 Document Reviewed: 04/30/2012 [facility] Patient Information 2015 [place], [place]. This information is not intended to replace advice given to you by your health care provider. Make sure you discuss any questions you have with your health care provider.

## 2014-04-18 NOTE — ED Notes (Signed)
[facility] pain. Pt states she had severe left flank pain this morning and urinary pressure.

## 2014-04-18 NOTE — ED Notes (Signed)
NAD noted at time of d/c inst

## 2014-04-19 LAB — GC/CHLAMYDIA PROBE AMP
CT Probe RNA: NEGATIVE
GC Probe RNA: NEGATIVE

## 2014-12-26 ENCOUNTER — Encounter (HOSPITAL_COMMUNITY): Payer: Self-pay | Admitting: Emergency Medicine

## 2014-12-26 ENCOUNTER — Emergency Department (HOSPITAL_COMMUNITY)
Admission: EM | Admit: 2014-12-26 | Discharge: 2014-12-26 | Disposition: A | Discharge status: Home / Self Care | Payer: BLUE CROSS/BLUE SHIELD | Attending: Emergency Medicine | Admitting: Emergency Medicine

## 2014-12-26 DIAGNOSIS — B349 Viral infection, unspecified: Secondary | ICD-10-CM | POA: Insufficient documentation

## 2014-12-26 DIAGNOSIS — Z791 Long term (current) use of non-steroidal anti-inflammatories (NSAID): Secondary | ICD-10-CM | POA: Insufficient documentation

## 2014-12-26 DIAGNOSIS — Z79899 Other long term (current) drug therapy: Secondary | ICD-10-CM | POA: Diagnosis not present

## 2014-12-26 DIAGNOSIS — R52 Pain, unspecified: Secondary | ICD-10-CM | POA: Diagnosis present

## 2014-12-26 DIAGNOSIS — Z720 Tobacco use: Secondary | ICD-10-CM | POA: Insufficient documentation

## 2014-12-26 LAB — BASIC METABOLIC PANEL
Anion gap: 7 (ref 5–15)
BUN: 15 mg/dL (ref 6–23)
CALCIUM: 9 mg/dL (ref 8.4–10.5)
CO2: 26 mmol/L (ref 19–32)
Chloride: 108 mmol/L (ref 96–112)
Creatinine, Ser: 0.71 mg/dL (ref 0.50–1.10)
GLUCOSE: 96 mg/dL (ref 70–99)
POTASSIUM: 4 mmol/L (ref 3.5–5.1)
Sodium: 141 mmol/L (ref 135–145)

## 2014-12-26 MED ORDER — CYCLOBENZAPRINE HCL 10 MG PO TABS: 10.0000 mg | ORAL_TABLET | Freq: Three times a day (TID) | ORAL | Status: DC | PRN

## 2014-12-26 MED ORDER — IBUPROFEN 800 MG PO TABS: 800.0000 mg | ORAL_TABLET | Freq: Three times a day (TID) | ORAL | Status: DC

## 2014-12-26 NOTE — Discharge Instructions (Signed)
Muscle Cramps and Spasms Muscle cramps and spasms are when muscles tighten by themselves. They usually get better within minutes. Muscle cramps are painful. They are usually stronger and last longer than muscle spasms. Muscle spasms may or may not be painful. They can last a few seconds or much longer. HOME CARE  Drink enough fluid to keep your pee (urine) clear or pale yellow.  Massage, stretch, and relax the muscle.  Use a warm towel, heating pad, or warm shower water on tight muscles.  Place ice on the muscle if it is tender or in pain.  Put ice in a plastic bag.  Place a towel between your skin and the bag.  Leave the ice on for 15-20 minutes, 03-04 times a day.  Only take medicine as told by your doctor. GET HELP RIGHT AWAY IF:  Your cramps or spasms get worse, happen more often, or do not get better with time. MAKE SURE YOU:  Understand these instructions.  Will watch your condition.  Will get help right away if you are not doing well or get worse. Document Released: 08/29/2008 Document Revised: 01/11/2013 Document Reviewed: 09/02/2012 [facility] Patient Information 2015 [place], [place]. This information is not intended to replace advice given to you by your health care provider. Make sure you discuss any questions you have with your health care provider.  Viral Infections A virus is a type of germ. Viruses can cause:  Minor sore throats.  Aches and pains.  Headaches.  Runny nose.  Rashes.  Watery eyes.  Tiredness.  Coughs.  Loss of appetite.  Feeling sick to your stomach (nausea).  Throwing up (vomiting).  Watery poop (diarrhea). HOME CARE   Only take medicines as told by your doctor.  Drink enough water and fluids to keep your pee (urine) clear or pale yellow. Sports drinks are a good choice.  Get plenty of rest and eat healthy. Soups and broths with crackers or rice are fine. GET HELP RIGHT AWAY IF:   You have a very bad headache.  You have  shortness of breath.  You have chest pain or neck pain.  You have an unusual rash.  You cannot stop throwing up.  You have watery poop that does not stop.  You cannot keep fluids down.  You or your child has a temperature by mouth above 102 F (38.9 C), not controlled by medicine.  Your baby is older than 3 months with a rectal temperature of 102 F (38.9 C) or higher.  Your baby is 65 months old or younger with a rectal temperature of 100.4 F (38 C) or higher. MAKE SURE YOU:   Understand these instructions.  Will watch this condition.  Will get help right away if you are not doing well or get worse. Document Released: 08/29/2008 Document Revised: 12/09/2011 Document Reviewed: 01/22/2011 [facility] Patient Information 2015 [place], [place]. This information is not intended to replace advice given to you by your health care provider. Make sure you discuss any questions you have with your health care provider.

## 2014-12-26 NOTE — ED Notes (Signed)
Awakened on Thursday with"heart racing"   Since then has aching /tingling in legs , feet and  Back of scalp, Had diarrhea on Saturday, none since then , no vomiting.  No fever,  "usual cough from smoking".  Alert, NAD,

## 2014-12-26 NOTE — ED Notes (Signed)
Having body since Thurs or Fri,  muscles ache.  Rates pain 7/10.  Have not taken any medication.  Feet feel like they are asleep.

## 2014-12-26 NOTE — ED Provider Notes (Signed)
CSN: [id number]     Arrival date & time 12/26/14  1022 History  This chart was scribed for non-physician practitioner, Kem Parkinson, PA-C, working with Virgel Manifold, MD, by Chester Holstein, ED Scribe. This patient was seen in room APFT23/APFT23 and the patient's care was started at 11:36 AM.     Chief Complaint  Patient presents with  . Generalized Body Aches      The history is provided by the patient. No language interpreter was used.  HPI Comments: Terri Franklin is a 44 y.o. female who presents to the Emergency Department complaining of generalized body aches with onset 2 days ago. Pt notes she awoke from rest 4 days ago with palpitations which resolved. Pt notes diarrhea the next day which has resolved. Pt notes associated chills, numbness in toes and tingling in back of head with onset yesterday which has also since resolved. Pt has not taken any medication for relief. Pt denies recent sick contacts.Continues to have generalized body aches.  Pt denies congestion, sneezing, cough, fever, vomiting, nausea, weakness, SOB, lower back pain, neck pain and headache.   History reviewed. No pertinent past medical history. Past Surgical History  Procedure Laterality Date  . Cesarean section      x 2   History reviewed. No pertinent family history. History  Substance Use Topics  . Smoking status: Current Every Day Smoker -- 0.50 packs/day    Types: Cigarettes  . Smokeless tobacco: Not on file  . Alcohol Use: No   OB History    No data available     Review of Systems  Constitutional: Positive for chills. Negative for fever.  HENT: Negative for congestion and sneezing.   Respiratory: Negative for cough and shortness of breath.   Gastrointestinal: Positive for diarrhea. Negative for nausea and vomiting.  Musculoskeletal: Positive for myalgias.  Neurological: Negative for dizziness, weakness, numbness and headaches.      Allergies  Shellfish allergy and Aspartame and  phenylalanine  Home Medications   Prior to Admission medications   Medication Sig Start Date End Date Taking? Authorizing Provider  HYDROcodone-acetaminophen (NORCO/VICODIN) 5-325 MG per tablet Take 1 tablet by mouth 3 (three) times daily as needed for moderate pain.    Historical Provider, MD  ibuprofen (ADVIL,MOTRIN) 200 MG tablet Take 200 mg by mouth every 6 (six) hours as needed. pain    Historical Provider, MD  naproxen sodium (ALEVE) 220 MG tablet Take 220 mg by mouth 2 (two) times daily as needed (for pain).    Historical Provider, MD  tamsulosin (FLOMAX) 0.4 MG CAPS capsule Take 0.4 mg by mouth daily.    Historical Provider, MD   BP 147/88 mmHg  Pulse 68  Temp(Src) 98.6 F (37 C) (Oral)  Resp 18  Ht 5\' 1"  (1.549 m)  Wt 178 lb (80.74 kg)  BMI 33.65 kg/m2  SpO2 100% Physical Exam  Constitutional: She is oriented to person, place, and time. She appears well-developed and well-nourished.  HENT:  Head: Normocephalic.  Mouth/Throat: Oropharynx is clear and moist.  Eyes: Conjunctivae are normal. Pupils are equal, round, and reactive to light.  Neck: Normal range of motion and phonation normal. Neck supple. No Kernig's sign noted.  Cardiovascular: Normal rate and regular rhythm.  Exam reveals no friction rub.   No murmur heard. Pulmonary/Chest: Effort normal and breath sounds normal. No respiratory distress. She has no wheezes. She has no rales.  Abdominal: Soft. She exhibits no distension. There is no tenderness. There is no rebound.  Musculoskeletal: Normal range of motion. She exhibits no edema.  Lymphadenopathy:    She has no cervical adenopathy.  Neurological: She is alert and oriented to person, place, and time.  Skin: Skin is warm and dry.  Psychiatric: She has a normal mood and affect. Her behavior is normal.  Nursing note and vitals reviewed.   ED Course  Procedures (including critical care time) DIAGNOSTIC STUDIES: Oxygen Saturation is 100% on room air, normal by  my interpretation.    COORDINATION OF CARE: 11:45 AM Discussed treatment plan with patient at beside, the patient agrees with the plan and has no further questions at this time.   Labs Review Labs Reviewed - No data to display  Imaging Review No results found.   EKG Interpretation None      Results for orders placed or performed during the hospital encounter of 98/92/11  Basic metabolic panel  Result Value Ref Range   Sodium 141 135 - 145 mmol/L   Potassium 4.0 3.5 - 5.1 mmol/L   Chloride 108 96 - 112 mmol/L   CO2 26 19 - 32 mmol/L   Glucose, Bld 96 70 - 99 mg/dL   BUN 15 6 - 23 mg/dL   Creatinine, Ser 0.71 0.50 - 1.10 mg/dL   Calcium 9.0 8.4 - 10.5 mg/dL   GFR calc non Af Amer >90 >90 mL/min   GFR calc Af Amer >90 >90 mL/min   Anion gap 7 5 - 15      MDM   Final diagnoses:  Viral illness   pt is well appearing.  Vitals stable.  No reported fever.  No nuchal rigidity.  Sx's likely viral.  Pt agrees to close f/u with PMD or return here for any worsening sx's   I personally performed the services described in this documentation, which was scribed in my presence. The recorded information has been reviewed and is accurate.     Patrice Paradise, PA-C 12/28/14 2228  Virgel Manifold, MD 12/29/14 [phone]

## 2015-03-26 ENCOUNTER — Emergency Department (HOSPITAL_COMMUNITY): Payer: BLUE CROSS/BLUE SHIELD

## 2015-03-26 ENCOUNTER — Emergency Department (HOSPITAL_COMMUNITY)
Admission: EM | Admit: 2015-03-26 | Discharge: 2015-03-26 | Disposition: A | Discharge status: Home / Self Care | Payer: BLUE CROSS/BLUE SHIELD | Attending: Emergency Medicine | Admitting: Emergency Medicine

## 2015-03-26 ENCOUNTER — Encounter (HOSPITAL_COMMUNITY): Payer: Self-pay | Admitting: Emergency Medicine

## 2015-03-26 DIAGNOSIS — Z720 Tobacco use: Secondary | ICD-10-CM | POA: Insufficient documentation

## 2015-03-26 DIAGNOSIS — R0789 Other chest pain: Secondary | ICD-10-CM | POA: Diagnosis not present

## 2015-03-26 DIAGNOSIS — Z791 Long term (current) use of non-steroidal anti-inflammatories (NSAID): Secondary | ICD-10-CM | POA: Diagnosis not present

## 2015-03-26 DIAGNOSIS — E119 Type 2 diabetes mellitus without complications: Secondary | ICD-10-CM | POA: Diagnosis not present

## 2015-03-26 DIAGNOSIS — R002 Palpitations: Secondary | ICD-10-CM | POA: Diagnosis not present

## 2015-03-26 DIAGNOSIS — R079 Chest pain, unspecified: Secondary | ICD-10-CM | POA: Diagnosis present

## 2015-03-26 HISTORY — DX: Type 2 diabetes mellitus without complications: E11.9

## 2015-03-26 LAB — COMPREHENSIVE METABOLIC PANEL
ALK PHOS: 86 U/L (ref 38–126)
ALT: 17 U/L (ref 14–54)
ANION GAP: 9 (ref 5–15)
AST: 18 U/L (ref 15–41)
Albumin: 4.3 g/dL (ref 3.5–5.0)
BUN: 22 mg/dL — ABNORMAL HIGH (ref 6–20)
CALCIUM: 9.4 mg/dL (ref 8.9–10.3)
CO2: 25 mmol/L (ref 22–32)
CREATININE: 0.74 mg/dL (ref 0.44–1.00)
Chloride: 106 mmol/L (ref 101–111)
GFR calc non Af Amer: >60 mL/min (ref >60)
Glucose, Bld: 100 mg/dL — ABNORMAL HIGH (ref 65–99)
Potassium: 4.1 mmol/L (ref 3.5–5.1)
SODIUM: 140 mmol/L (ref 135–145)
Total Bilirubin: 0.4 mg/dL (ref 0.3–1.2)
Total Protein: 7.1 g/dL (ref 6.5–8.1)

## 2015-03-26 LAB — CBC WITH DIFFERENTIAL/PLATELET
BASOS ABS: 0 10*3/uL (ref 0.0–0.1)
Basophils Relative: 0 % (ref 0–1)
EOS ABS: 0.3 10*3/uL (ref 0.0–0.7)
Eosinophils Relative: 4 % (ref 0–5)
HCT: 42.5 % (ref 36.0–46.0)
Hemoglobin: 14.1 g/dL (ref 12.0–15.0)
Lymphocytes Relative: 34 % (ref 12–46)
Lymphs Abs: 3.3 10*3/uL (ref 0.7–4.0)
MCH: 31.5 pg (ref 26.0–34.0)
MCHC: 33.2 g/dL (ref 30.0–36.0)
MCV: 95.1 fL (ref 78.0–100.0)
MONOS PCT: 5 % (ref 3–12)
Monocytes Absolute: 0.5 10*3/uL (ref 0.1–1.0)
NEUTROS ABS: 5.6 10*3/uL (ref 1.7–7.7)
Neutrophils Relative %: 57 % (ref 43–77)
Platelets: 205 10*3/uL (ref 150–400)
RBC: 4.47 MIL/uL (ref 3.87–5.11)
RDW: 12.8 % (ref 11.5–15.5)
WBC: 9.7 10*3/uL (ref 4.0–10.5)

## 2015-03-26 LAB — TROPONIN I: Troponin I: <0.03 ng/mL (ref <0.031)

## 2015-03-26 MED ORDER — KETOROLAC TROMETHAMINE 30 MG/ML IJ SOLN
30.0000 mg | Freq: Once | INTRAMUSCULAR | Status: AC
Start: 2015-03-26 — End: 2015-03-26
  Administered 2015-03-26: 30 mg via INTRAVENOUS
  Filled 2015-03-26: qty 1

## 2015-03-26 MED ORDER — IBUPROFEN 800 MG PO TABS: 800.0000 mg | ORAL_TABLET | Freq: Three times a day (TID) | ORAL | Status: DC | PRN

## 2015-03-26 NOTE — ED Notes (Signed)
Pt says while she was at church today, it felt like her heart flipped.  Denies pain, c/o soreness and spasms.

## 2015-03-26 NOTE — ED Provider Notes (Signed)
TIME SEEN: 12:31 PM  CHIEF COMPLAINT: Chest pain   HPI:  Terri Franklin is a 44 y.o. female, with a PMhx of non-insulin-dependent diabetes and tobacco use, who presents to the Emergency Department complaining of palpitations and chest pain. Pt states she was in church around 11 am this morning and experienced a sensation of her heart flipping with subsequent intermittent spasms of left sided chest tightness lasting in 1 second intervals that induced SOB stating she felt she could not catch her breath. She denies any exacerbating or improving factors. Additionally denies a history of ACS or CHF, previous stress test or catheterization, a PMhx of PE/DVT,  recent surgery or hospitalization, exogenous estrogen use, recent long travels or flights, nausea, vomiting, diarrhea, or cough.  No lightheadedness. No diaphoresis. Pain is worse with palpation and movement. Better with staying still.   ROS: See HPI Constitutional: no fever  Eyes: no drainage  ENT: no runny nose   Cardiovascular:   chest pain  Resp: SOB  GI: no vomiting GU: no dysuria Integumentary: no rash  Allergy: no hives  Musculoskeletal: no leg swelling  Neurological: no slurred speech ROS otherwise negative  PAST MEDICAL HISTORY/PAST SURGICAL HISTORY:  Past Medical History  Diagnosis Date  . Diabetes mellitus without complication     hx of type II     MEDICATIONS:  Prior to Admission medications   Medication Sig Start Date End Date Taking? Authorizing Provider  cyclobenzaprine (FLEXERIL) 10 MG tablet Take 1 tablet (10 mg total) by mouth 3 (three) times daily as needed. 12/26/14   Tammy Triplett, PA-C  ibuprofen (ADVIL,MOTRIN) 800 MG tablet Take 1 tablet (800 mg total) by mouth 3 (three) times daily. Take with food 12/26/14   Tammy Triplett, PA-C  naproxen sodium (ALEVE) 220 MG tablet Take 220 mg by mouth 2 (two) times daily as needed (for pain).    Historical Provider, MD    ALLERGIES:  Allergies  Allergen Reactions  .  Shellfish Allergy Anaphylaxis  . Aspartame And Phenylalanine     SOCIAL HISTORY:  History  Substance Use Topics  . Smoking status: Current Every Day Smoker -- 0.50 packs/day    Types: Cigarettes  . Smokeless tobacco: Not on file  . Alcohol Use: No    FAMILY HISTORY: Family History  Problem Relation Age of Onset  . Heart failure Mother   . Diabetes Father     EXAM: BP 133/89 mmHg  Pulse 66  Temp(Src) 97.6 F (36.4 C) (Oral)  Resp 18  Ht 5\' 1"  (1.549 m)  Wt 182 lb (82.555 kg)  BMI 34.41 kg/m2  SpO2 95% CONSTITUTIONAL: Alert and oriented and responds appropriately to questions. Well-appearing; well-nourished HEAD: Normocephalic EYES: Conjunctivae clear, PERRL ENT: normal nose; no rhinorrhea; moist mucous membranes; pharynx without lesions noted NECK: Supple, no meningismus, no LAD  CARD: RRR; S1 and S2 appreciated; no murmurs, no clicks, no rubs, no gallops CHEST:  Patient's left chest wall tender to palpation without crepitus or ecchymosis or deformity, no lesions noted in this area, no rash RESP: Normal chest excursion without splinting or tachypnea; breath sounds clear and equal bilaterally; no wheezes, no rhonchi, no rales, no hypoxia or respiratory distress, speaking full sentences ABD/GI: Normal bowel sounds; non-distended; soft, non-tender, no rebound, no guarding, no peritoneal signs BACK:  The back appears normal and is non-tender to palpation, there is no CVA tenderness EXT: Normal ROM in all joints; non-tender to palpation; no edema; normal capillary refill; no cyanosis, no calf tenderness or  swelling    SKIN: Normal color for age and race; warm NEURO: Moves all extremities equally, sensation to light touch intact diffusely, cranial nerves II through XII intact PSYCH: The patient's mood and manner are appropriate. Grooming and personal hygiene are appropriate.  MEDICAL DECISION MAKING: Patient here with atypical chest pain. Symptoms never last longer than one  second. She is tender to palpation over her chest wall. EKG shows no ischemic changes. Hemodynamically stable. Labs unremarkable including negative troponin. Chest x-ray clear. Patient reports feeling much better after Toradol. I feel she is safe to be discharged home. She has no risk factors for pulmonary embolus. I have very low suspicion that this is ACS. Discussed return precautions. She verbalizes understanding is comfortable with plan. We'll discharge with prescription for ibuprofen to use as needed for pain.    EKG Interpretation  Date/Time:  Sunday March 26 2015 12:03:42 EDT Ventricular Rate:  62 PR Interval:  131 QRS Duration: 97 QT Interval:  396 QTC Calculation: 402 R Axis:   63 Text Interpretation:  Sinus rhythm No significant change since last tracing in 2013 Confirmed by Tremayne Sheldon,  DO, Charlii Yost [phone]) on 03/26/2015 12:10:05 PM       I personally performed the services described in this documentation, which was scribed in my presence. The recorded information has been reviewed and is accurate.    [place], DO 03/26/15 1536

## 2015-03-26 NOTE — Discharge Instructions (Signed)
Chest Wall Pain Chest wall pain is pain in or around the bones and muscles of your chest. It may take up to 6 weeks to get better. It may take longer if you must stay physically active in your work and activities.  CAUSES  Chest wall pain may happen on its own. However, it may be caused by:  A viral illness like the flu.  Injury.  Coughing.  Exercise.  Arthritis.  Fibromyalgia.  Shingles. HOME CARE INSTRUCTIONS   Avoid overtiring physical activity. Try not to strain or perform activities that cause pain. This includes any activities using your chest or your abdominal and side muscles, especially if heavy weights are used.  Put ice on the sore area.  Put ice in a plastic bag.  Place a towel between your skin and the bag.  Leave the ice on for 15-20 minutes per hour while awake for the first 2 days.  Only take over-the-counter or prescription medicines for pain, discomfort, or fever as directed by your caregiver. SEEK IMMEDIATE MEDICAL CARE IF:   Your pain increases, or you are very uncomfortable.  You have a fever.  Your chest pain becomes worse.  You have new, unexplained symptoms.  You have nausea or vomiting.  You feel sweaty or lightheaded.  You have a cough with phlegm (sputum), or you cough up blood. MAKE SURE YOU:   Understand these instructions.  Will watch your condition.  Will get help right away if you are not doing well or get worse. Document Released: 09/16/2005 Document Revised: 12/09/2011 Document Reviewed: 05/13/2011 [facility] Patient Information 2015 [place], [place]. This information is not intended to replace advice given to you by your health care provider. Make sure you discuss any questions you have with your health care provider.  Palpitations A palpitation is the feeling that your heartbeat is irregular or is faster than normal. It may feel like your heart is fluttering or skipping a beat. Palpitations are usually not a serious problem.  However, in some cases, you may need further medical evaluation. CAUSES  Palpitations can be caused by:  Smoking.  Caffeine or other stimulants, such as diet pills or energy drinks.  Alcohol.  Stress and anxiety.  Strenuous physical activity.  Fatigue.  Certain medicines.  Heart disease, especially if you have a history of irregular heart rhythms (arrhythmias), such as atrial fibrillation, atrial flutter, or supraventricular tachycardia.  An improperly working pacemaker or defibrillator. DIAGNOSIS  To find the cause of your palpitations, your health care provider will take your medical history and perform a physical exam. Your health care provider may also have you take a test called an ambulatory electrocardiogram (ECG). An ECG records your heartbeat patterns over a 24-hour period. You may also have other tests, such as:  Transthoracic echocardiogram (TTE). During echocardiography, sound waves are used to evaluate how blood flows through your heart.  Transesophageal echocardiogram (TEE).  Cardiac monitoring. This allows your health care provider to monitor your heart rate and rhythm in real time.  Holter monitor. This is a portable device that records your heartbeat and can help diagnose heart arrhythmias. It allows your health care provider to track your heart activity for several days, if needed.  Stress tests by exercise or by giving medicine that makes the heart beat faster. TREATMENT  Treatment of palpitations depends on the cause of your symptoms and can vary greatly. Most cases of palpitations do not require any treatment other than time, relaxation, and monitoring your symptoms. Other causes, such  as atrial fibrillation, atrial flutter, or supraventricular tachycardia, usually require further treatment. HOME CARE INSTRUCTIONS   Avoid:  Caffeinated coffee, tea, soft drinks, diet pills, and energy drinks.  Chocolate.  Alcohol.  Stop smoking if you smoke.  Reduce  your stress and anxiety. Things that can help you relax include:  A method of controlling things in your body, such as your heartbeats, with your mind (biofeedback).  Yoga.  Meditation.  Physical activity such as swimming, jogging, or walking.  Get plenty of rest and sleep. SEEK MEDICAL CARE IF:   You continue to have a fast or irregular heartbeat beyond 24 hours.  Your palpitations occur more often. SEEK IMMEDIATE MEDICAL CARE IF:  You have chest pain or shortness of breath.  You have a severe headache.  You feel dizzy or you faint. MAKE SURE YOU:  Understand these instructions.  Will watch your condition.  Will get help right away if you are not doing well or get worse. Document Released: 09/13/2000 Document Revised: 09/21/2013 Document Reviewed: 11/15/2011 [facility] Patient Information 2015 [place], [place]. This information is not intended to replace advice given to you by your health care provider. Make sure you discuss any questions you have with your health care provider.

## 2015-03-26 NOTE — ED Notes (Addendum)
PT states she was singing at church today and starting having heart palpitations and now c/o headache and chest pains that radiate to her left shoulder blade with some SOB.

## 2015-08-15 IMAGING — CT CT ABD-PELV W/O CM
3 of 4 series · 9 of 46 positions shown, 16 images · non-contrast
Comparison: None.

CLINICAL DATA: Left flank pain .

EXAM:
CT ABDOMEN AND PELVIS WITHOUT CONTRAST
TECHNIQUE: Multidetector CT imaging of the abdomen and pelvis was performed
following the standard protocol without IV contrast.

[Series 3: mpr coronal (id) · coronal · 0.74mm/px · 3 of 83 slices shown, 4 images]
[im 28/83  soft-tissue]
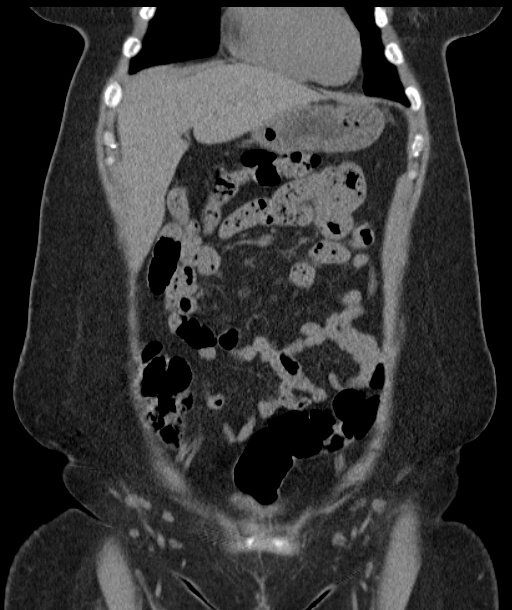
[im 37/83  soft-tissue]
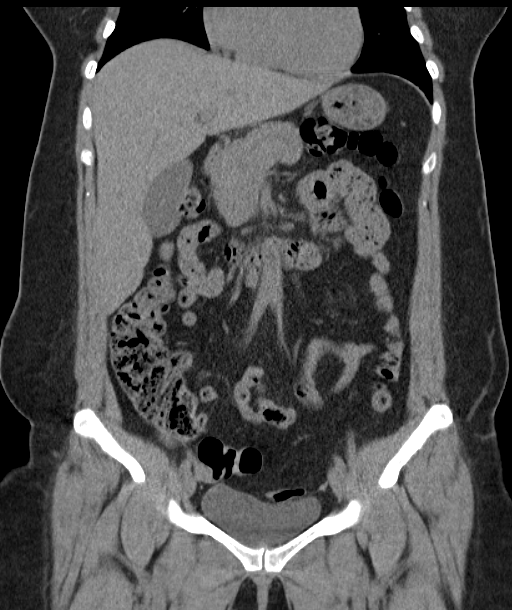
[im 37/83  bone]
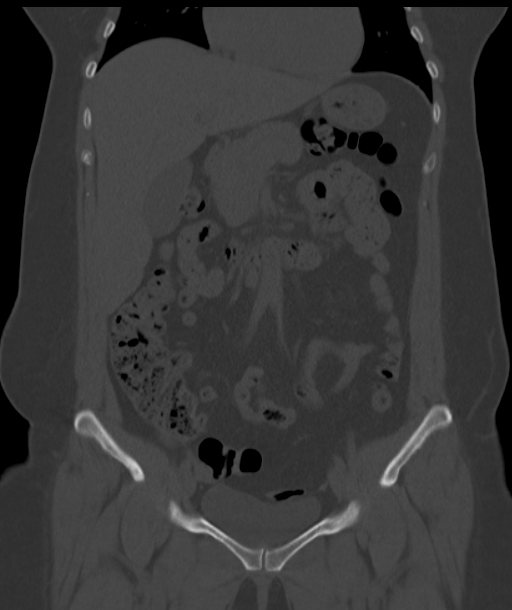
[im 46/83  soft-tissue]
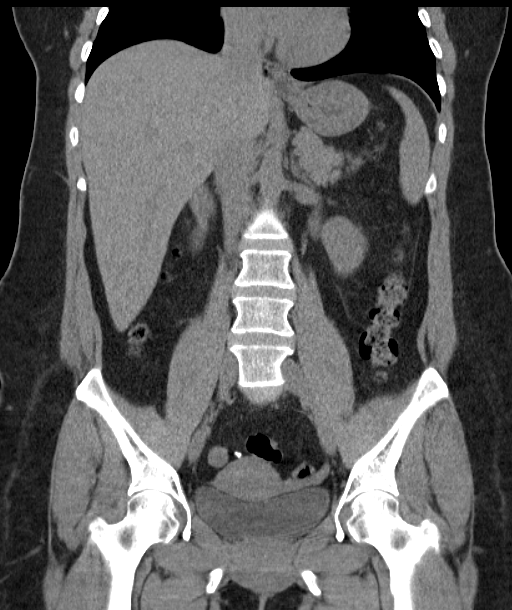

[Series 4: mpr sagittal (id) · sagittal · 0.54mm/px · 1 of 114 slices shown, 2 images]
[im 38/114  soft-tissue]
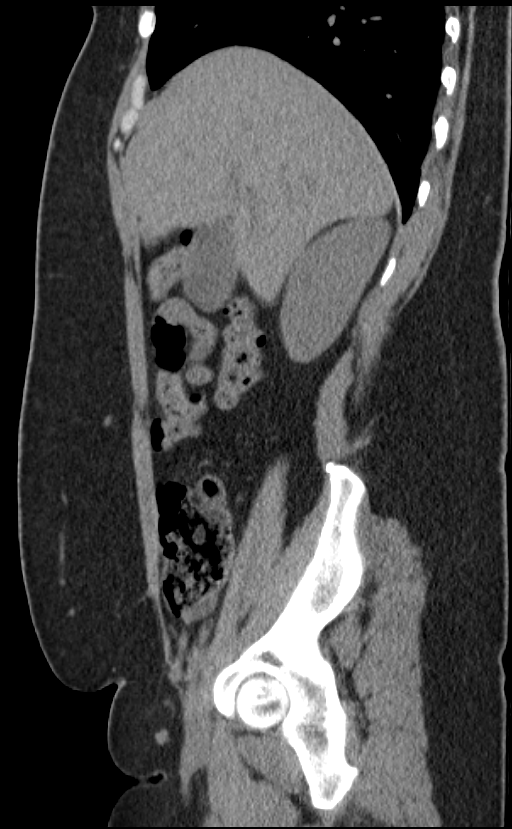
[im 38/114  bone]
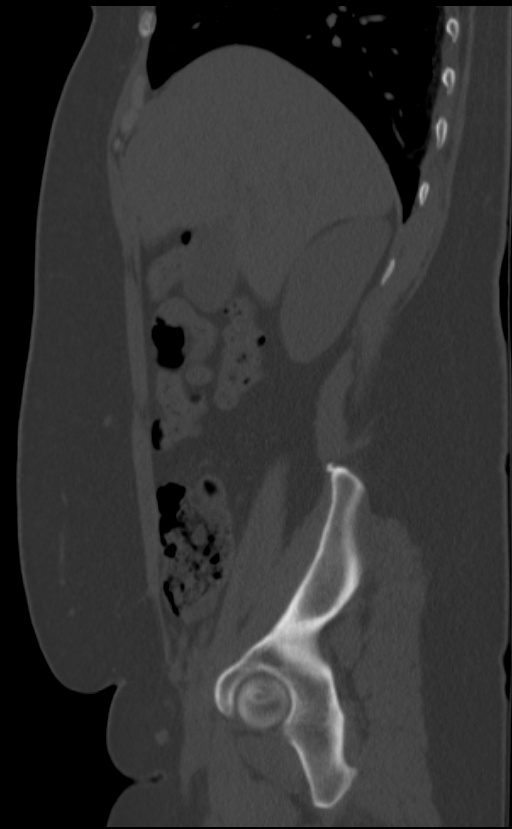

[Series 6: lung 5.0 b60f · axial · 0.77mm/px · z∈[-96,-16]mm · 5 of 25 slices shown, 10 images]
[im 5/25  soft-tissue]
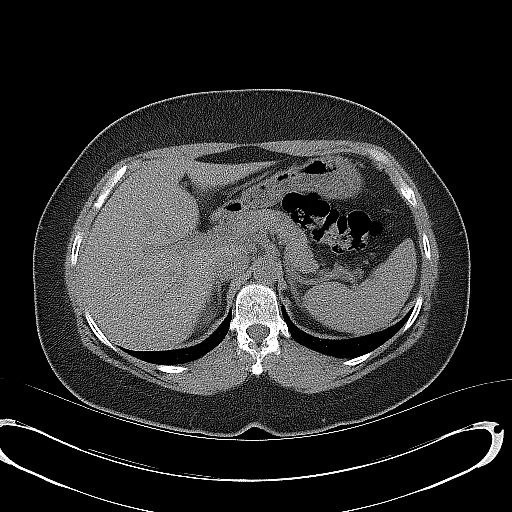
[im 5/25  bone]
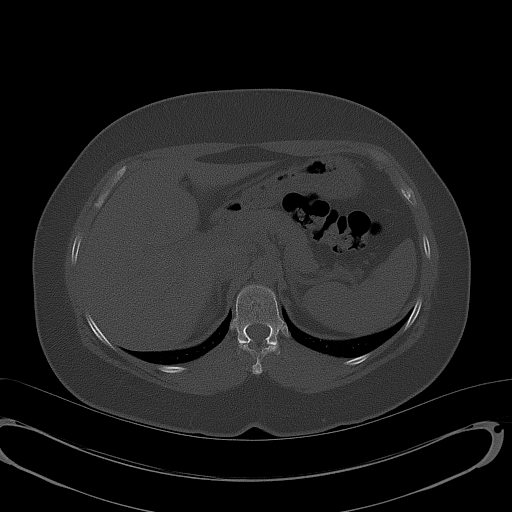
[im 9/25  soft-tissue]
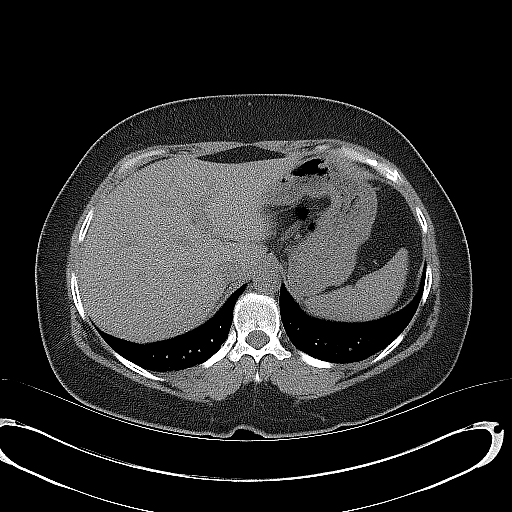
[im 9/25  lung]
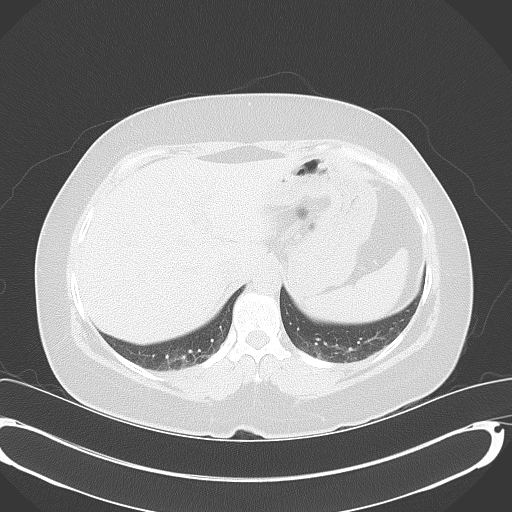
[im 13/25  soft-tissue]
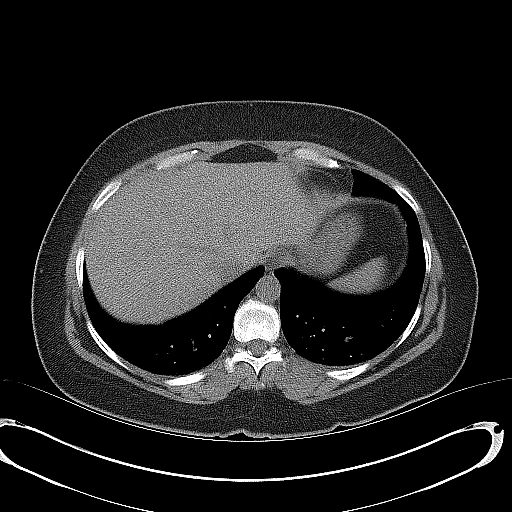
[im 13/25  lung]
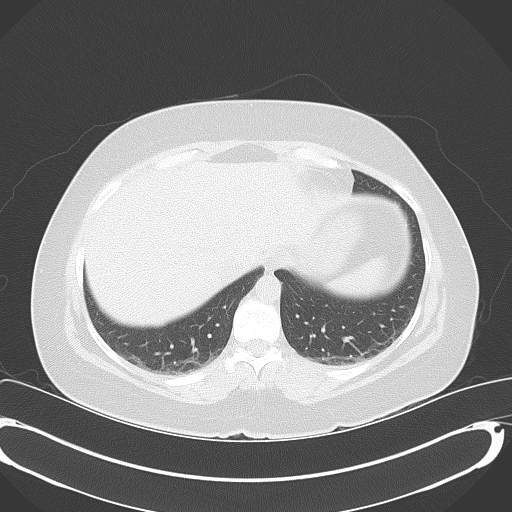
[im 17/25  soft-tissue]
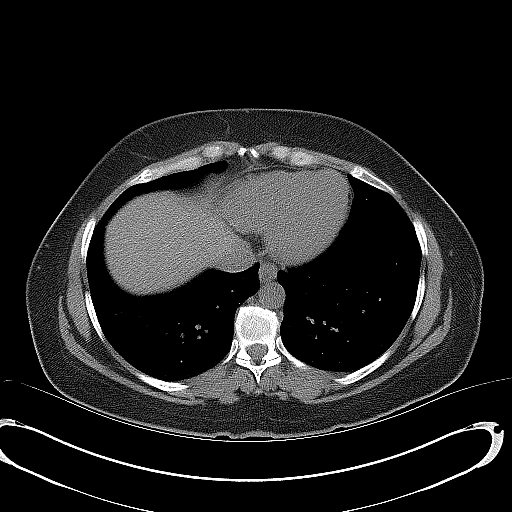
[im 17/25  lung]
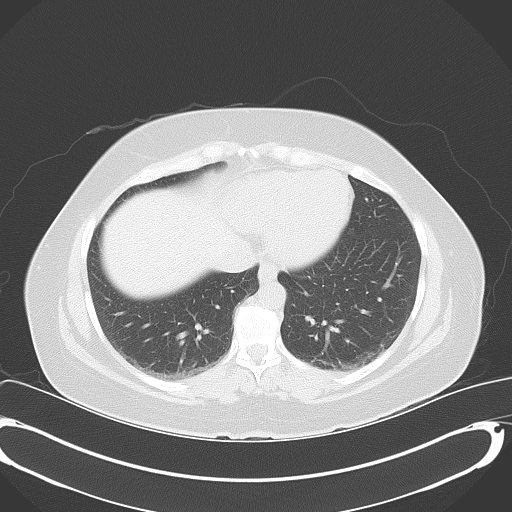
[im 21/25  soft-tissue]
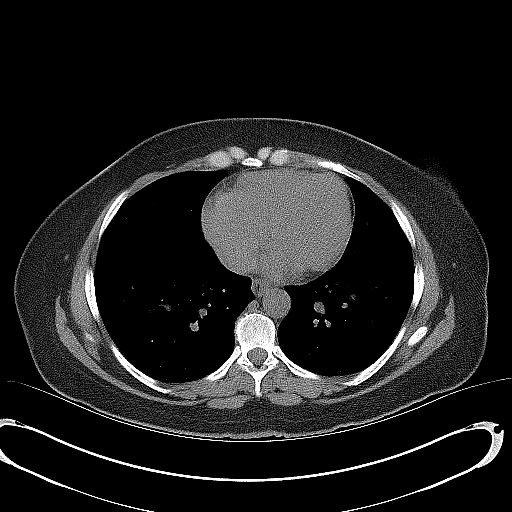
[im 21/25  lung]
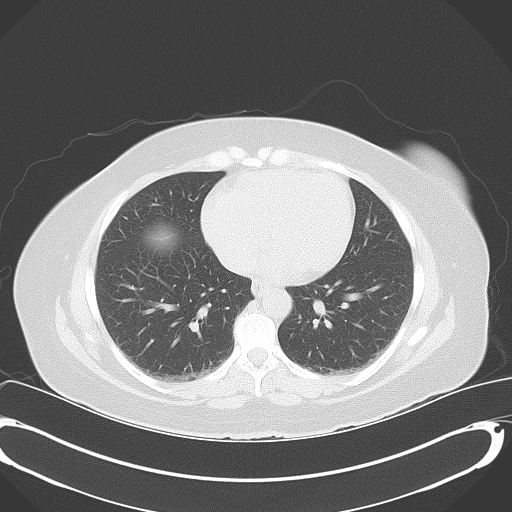

[9 of 46 positions shown; findings below may reference images not displayed]

FINDINGS: The liver normal. Spleen normal. Pancreas normal. No biliary
distention. Gallbladder is nondistended.

Adrenals normal. Kidneys normal. No hydronephrosis or obstructing
ureteral stone. Bladder is nondistended. Uterus and adnexa normal.
Tiny amount of free pelvic fluid present.

No significant retroperitoneal adenopathy. Abdominal aorta normal in
caliber.

Appendix normal. No bowel distention. No free air. Stomach is
nondistended. Tiny mesenteric nonspecific lymph nodes are noted.
Mesenteric adenitis cannot be excluded. No hernia.

Mild atelectasis lung bases. Heart size normal. No acute bony
abnormality. Tiny sclerotic densities in the pelvis statistically
most likely bone islands.
IMPRESSION: Tiny mesenteric lymph nodes, these are nonspecific. Mesenteric
adenitis could present this fashion.

## 2018-10-02 ENCOUNTER — Emergency Department (HOSPITAL_COMMUNITY)
Admission: EM | Admit: 2018-10-02 | Discharge: 2018-10-02 | Disposition: A | Discharge status: Home / Self Care | Payer: BLUE CROSS/BLUE SHIELD | Attending: Emergency Medicine | Admitting: Emergency Medicine

## 2018-10-02 ENCOUNTER — Encounter (HOSPITAL_COMMUNITY): Payer: Self-pay

## 2018-10-02 ENCOUNTER — Other Ambulatory Visit: Payer: Self-pay

## 2018-10-02 ENCOUNTER — Emergency Department (HOSPITAL_COMMUNITY): Payer: BLUE CROSS/BLUE SHIELD

## 2018-10-02 DIAGNOSIS — R110 Nausea: Secondary | ICD-10-CM | POA: Diagnosis not present

## 2018-10-02 DIAGNOSIS — R002 Palpitations: Secondary | ICD-10-CM

## 2018-10-02 DIAGNOSIS — Z79899 Other long term (current) drug therapy: Secondary | ICD-10-CM | POA: Insufficient documentation

## 2018-10-02 DIAGNOSIS — E119 Type 2 diabetes mellitus without complications: Secondary | ICD-10-CM | POA: Insufficient documentation

## 2018-10-02 DIAGNOSIS — Z87891 Personal history of nicotine dependence: Secondary | ICD-10-CM | POA: Insufficient documentation

## 2018-10-02 LAB — COMPREHENSIVE METABOLIC PANEL
ALT: 22 U/L (ref 0–44)
AST: 18 U/L (ref 15–41)
Albumin: 4.2 g/dL (ref 3.5–5.0)
Alkaline Phosphatase: 61 U/L (ref 38–126)
Anion gap: 8 (ref 5–15)
BUN: 19 mg/dL (ref 6–20)
CHLORIDE: 106 mmol/L (ref 98–111)
CO2: 24 mmol/L (ref 22–32)
CREATININE: 0.8 mg/dL (ref 0.44–1.00)
Calcium: 9.5 mg/dL (ref 8.9–10.3)
GFR calc non Af Amer: >60 mL/min (ref >60)
Glucose, Bld: 116 mg/dL — ABNORMAL HIGH (ref 70–99)
Potassium: 4.5 mmol/L (ref 3.5–5.1)
Sodium: 138 mmol/L (ref 135–145)
Total Bilirubin: 0.9 mg/dL (ref 0.3–1.2)
Total Protein: 7 g/dL (ref 6.5–8.1)

## 2018-10-02 LAB — TROPONIN I
Troponin I: <0.03 ng/mL (ref <0.03)
Troponin I: <0.03 ng/mL (ref <0.03)

## 2018-10-02 LAB — CBC WITH DIFFERENTIAL/PLATELET
Abs Immature Granulocytes: 0.03 10*3/uL (ref 0.00–0.07)
Basophils Absolute: 0 10*3/uL (ref 0.0–0.1)
Basophils Relative: 1 %
EOS ABS: 0.2 10*3/uL (ref 0.0–0.5)
EOS PCT: 3 %
HEMATOCRIT: 40.5 % (ref 36.0–46.0)
Hemoglobin: 13 g/dL (ref 12.0–15.0)
Immature Granulocytes: 0 %
LYMPHS ABS: 1.7 10*3/uL (ref 0.7–4.0)
Lymphocytes Relative: 25 %
MCH: 30.6 pg (ref 26.0–34.0)
MCHC: 32.1 g/dL (ref 30.0–36.0)
MCV: 95.3 fL (ref 80.0–100.0)
MONO ABS: 0.4 10*3/uL (ref 0.1–1.0)
MONOS PCT: 6 %
Neutro Abs: 4.4 10*3/uL (ref 1.7–7.7)
Neutrophils Relative %: 65 %
PLATELETS: 217 10*3/uL (ref 150–400)
RBC: 4.25 MIL/uL (ref 3.87–5.11)
RDW: 12.8 % (ref 11.5–15.5)
WBC: 6.8 10*3/uL (ref 4.0–10.5)
nRBC: 0 % (ref 0.0–0.2)

## 2018-10-02 LAB — LIPASE, BLOOD: Lipase: 43 U/L (ref 11–51)

## 2018-10-02 LAB — POC URINE PREG, ED: Preg Test, Ur: NEGATIVE

## 2018-10-02 LAB — D-DIMER, QUANTITATIVE (NOT AT ARMC)

## 2018-10-02 LAB — PREGNANCY, URINE: Preg Test, Ur: NEGATIVE

## 2018-10-02 MED ORDER — DIPHENHYDRAMINE HCL 50 MG/ML IJ SOLN
25.0000 mg | Freq: Once | INTRAMUSCULAR | Status: AC
  Administered 2018-10-02: 25 mg via INTRAVENOUS
  Filled 2018-10-02: qty 1

## 2018-10-02 MED ORDER — KETOROLAC TROMETHAMINE 30 MG/ML IJ SOLN
15.0000 mg | Freq: Once | INTRAMUSCULAR | Status: AC
  Administered 2018-10-02: 15 mg via INTRAVENOUS
  Filled 2018-10-02: qty 1

## 2018-10-02 MED ORDER — ACETAMINOPHEN 325 MG PO TABS
650.0000 mg | ORAL_TABLET | Freq: Once | ORAL | Status: AC
  Administered 2018-10-02: 650 mg via ORAL
  Filled 2018-10-02: qty 2

## 2018-10-02 MED ORDER — ONDANSETRON HCL 4 MG/2ML IJ SOLN
4.0000 mg | Freq: Once | INTRAMUSCULAR | Status: AC
Start: 2018-10-02 — End: 2018-10-02
  Administered 2018-10-02: 4 mg via INTRAVENOUS
  Filled 2018-10-02: qty 2

## 2018-10-02 MED ORDER — ONDANSETRON 4 MG PO TBDP: 4.0000 mg | ORAL_TABLET | Freq: Three times a day (TID) | ORAL | 0 refills | Status: DC | PRN

## 2018-10-02 MED ORDER — PROCHLORPERAZINE EDISYLATE 10 MG/2ML IJ SOLN
10.0000 mg | Freq: Once | INTRAMUSCULAR | Status: AC
  Administered 2018-10-02: 10 mg via INTRAVENOUS
  Filled 2018-10-02: qty 2

## 2018-10-02 NOTE — ED Notes (Signed)
IV actually not removed, pt started having cp during d/c so IV left in place.

## 2018-10-02 NOTE — ED Notes (Signed)
Pt became tearful during d/c. And said cp returned.  Will notify provider.  PT still in room.

## 2018-10-02 NOTE — ED Provider Notes (Signed)
[facility] EMERGENCY DEPARTMENT Provider Note   CSN: [id number] Arrival date & time: 10/02/18  0630     History   Chief Complaint Chief Complaint  Patient presents with  . Tachycardia  . Abdominal Pain    HPI Terri Franklin is a 48 y.o. female with multiple complaints, the first being a one month history of fleeting episodes of heart palpitations which has been unchanged, but over the past week has also been accompanied by nausea without emesis, and abdominal pain described as cramping along her left mid to lower abdomen along with bloating, increased burping and flatulence. She denies constipation or diarrhea, reports having a large bm this am with no relief of sx.  Additionally, has had increasing sob with exertion only, describes last night getting sob just walking across the room. She denies leg swelling or pain and has no risk factors for dvt.  She has had no recent flu like sx, no cough, fever, denies chest pain, dizziness.  She does endorse a moderate headache today.  The history is provided by the patient.    Past Medical History:  Diagnosis Date  . 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   Past Surgical History:  Procedure Laterality Date  . CESAREAN SECTION     x 2     OB History    Gravida      Para      Term      Preterm      AB      Living  2     SAB      TAB      Ectopic      Multiple      Live Births               Home Medications    Prior to Admission medications   Medication Sig Start Date End Date Taking? Authorizing Provider  aspirin-acetaminophen-caffeine (EXCEDRIN MIGRAINE) [id number] MG per tablet Take 2 tablets by mouth every 8 (eight) hours as needed for headache or migraine.   Yes [provider]  ELDERBERRY PO Take 1 capsule by mouth daily.   Yes [provider]  VITAMIN D, ERGOCALCIFEROL, PO Take 1 capsule by mouth  daily.   Yes [provider]  ondansetron (ZOFRAN ODT) 4 MG disintegrating tablet Take 1 tablet (4 mg total) by mouth every 8 (eight) hours as needed for nausea or vomiting. 10/02/18   Evalee Jefferson, PA-C    Family History Family History  Problem Relation Age of Onset  . Heart failure Mother   . Diabetes Father     Social History Social History   Tobacco Use  . Smoking status: Former Smoker    Packs/day: 0.50    Types: Cigarettes  . Smokeless tobacco: Never Used  Substance Use Topics  . Alcohol use: No  . Drug use: No     Allergies   Shellfish allergy and Aspartame and phenylalanine   Review of Systems Review of Systems  Constitutional: Negative for fever.  HENT: Negative for congestion and sore throat.   Eyes: Negative.   Respiratory: Positive for shortness of breath. Negative for cough and chest tightness.   Cardiovascular: Positive for palpitations. Negative for chest pain and leg swelling.  Gastrointestinal: Positive for abdominal pain and nausea. Negative for constipation, diarrhea and vomiting.  Genitourinary: Negative.   Musculoskeletal: Negative for arthralgias,  joint swelling and neck pain.  Skin: Negative.  Negative for rash and wound.  Neurological: Positive for headaches. Negative for dizziness, weakness, light-headedness and numbness.  Psychiatric/Behavioral: Negative.      Physical Exam Updated Vital Signs BP 120/70   Pulse 60   Temp 97.9 F (36.6 C) (Oral)   Resp 14   Ht 5\' 1"  (1.549 m)   Wt 93.4 kg   SpO2 96%   BMI 38.92 kg/m   Physical Exam Vitals signs and nursing note reviewed.  Constitutional:      Appearance: Normal appearance. She is well-developed.  HENT:     Head: Normocephalic and atraumatic.     Mouth/Throat:     Mouth: Mucous membranes are moist.  Eyes:     Conjunctiva/sclera: Conjunctivae normal.  Neck:     Musculoskeletal: Normal range of motion.  Cardiovascular:     Rate and Rhythm: Normal rate and regular  rhythm.     Pulses: Normal pulses.     Heart sounds: Normal heart sounds. No murmur. No gallop.   Pulmonary:     Effort: Pulmonary effort is normal.     Breath sounds: Normal breath sounds. No wheezing.  Abdominal:     General: Bowel sounds are normal.     Palpations: Abdomen is soft.     Tenderness: There is abdominal tenderness in the left upper quadrant and left lower quadrant. There is no guarding. Negative signs include Murphy's sign and McBurney's sign.  Musculoskeletal: Normal range of motion.     Right lower leg: No edema.     Left lower leg: No edema.  Skin:    General: Skin is warm and dry.  Neurological:     Mental Status: She is alert.      ED Treatments / Results  Labs (all labs ordered are listed, but only abnormal results are displayed) Labs Reviewed  COMPREHENSIVE METABOLIC PANEL - Abnormal; Notable for the following components:      Result Value   Glucose, Bld 116 (*)    All other components within normal limits  CBC WITH DIFFERENTIAL/PLATELET  LIPASE, BLOOD  PREGNANCY, URINE  TROPONIN I  D-DIMER, QUANTITATIVE (NOT AT [facility])  POC URINE PREG, ED    EKG None  Radiology Dg Acute Abd 2+v Abd (supine,erect,decub) + 1v Chest  Result Date: 10/02/2018 CLINICAL DATA:  Acute left-sided abdominal pain. EXAM: DG ABDOMEN ACUTE W/ 1V CHEST COMPARISON:  None. FINDINGS: There is no evidence of dilated bowel loops or free intraperitoneal air. No radiopaque calculi or other significant radiographic abnormality is seen. Heart size and mediastinal contours are within normal limits. Both lungs are clear. IMPRESSION: No evidence of bowel obstruction or ileus. No acute cardiopulmonary disease. Electronically Signed   By: Marijo Conception, M.D.   On: 10/02/2018 10:26    Procedures Procedures (including critical care time)  Medications Ordered in ED Medications  ondansetron (ZOFRAN) injection 4 mg (4 mg Intravenous Given 10/02/18 0846)  acetaminophen (TYLENOL) tablet 650 mg  (650 mg Oral Given 10/02/18 0846)     Initial Impression / Assessment and Plan / ED Course  I have reviewed the triage vital signs and the nursing notes.  Pertinent labs & imaging results that were available during my care of the patient were reviewed by me and considered in my medical decision making (see chart for details).     After work-up was complete I discussed lab, x-ray and EKG findings.  Delta troponin was negative.  Patient endorses that she has  a significant stressful job and notes that she just had 9 days off for the holidays and was asymptomatic during that time, then return to work this week and symptoms have returned, patient suspects this may be stress induced.  She has taken Nexium the last 2 nights which has improved her eructation somewhat.  She has follow-up with her PCP in 3 days for the symptoms as well.  She had no ectopy while she was monitored during her ED visit today, although had several fleeting episodes of "palpitation" symptom while here.  It is possible her sensation is a musculoskeletal source versus anxiety induced as there is no PVCs, PACs or other ectopy noted.  She was prescribed Zofran for as needed use if nausea returns.  Planned follow-up with her PCP on Monday.  Discussed that she may need to consider a Holter monitor which she can discuss with her PCP on Monday.  Final Clinical Impressions(s) / ED Diagnoses   Final diagnoses:  Nausea  Palpitations    ED Discharge Orders         Ordered    ondansetron (ZOFRAN ODT) 4 MG disintegrating tablet  Every 8 hours PRN     10/02/18 1108           Evalee Jefferson, PA-C 10/02/18 1644    Davonna Belling, MD 10/03/18 1531

## 2018-10-02 NOTE — ED Notes (Signed)
Ambulated around ED.   o2 sats remained at 100%.  Hr was 90.  Did c/o a little sob and a "flutter" in chest. Otherwise no complaints. Gait was good.

## 2018-10-02 NOTE — Discharge Instructions (Addendum)
Your labs, EKG and imaging today is normal as discussed.  You may continue using your Nexium.  Use the Zofran if needed for return of nausea.  As discussed follow-up with your primary doctor on Monday.  I encourage a discussion with her about having a cardiac monitor ordered to assist you further for these episodes of palpitations.  There were no episodes of palpitations found today on our monitors during this visit, but a Holter monitor would be recommended as an outpatient for further evaluation of your symptoms.

## 2018-10-02 NOTE — ED Triage Notes (Signed)
Pt report has been feeling heart flutter off and on since Dec.  Reports has been having what she thinks is gas pain off and on as well.  Today c/o pain in left abd and headache.  Reports nausea, no vomiting.   LBM was this morning.

## 2021-01-19 ENCOUNTER — Encounter (INDEPENDENT_AMBULATORY_CARE_PROVIDER_SITE_OTHER): Payer: Self-pay | Admitting: *Deleted

## 2021-02-07 ENCOUNTER — Encounter (INDEPENDENT_AMBULATORY_CARE_PROVIDER_SITE_OTHER): Payer: Self-pay | Admitting: *Deleted

## 2021-02-07 ENCOUNTER — Other Ambulatory Visit (INDEPENDENT_AMBULATORY_CARE_PROVIDER_SITE_OTHER): Payer: Self-pay | Admitting: *Deleted

## 2021-02-08 ENCOUNTER — Telehealth (INDEPENDENT_AMBULATORY_CARE_PROVIDER_SITE_OTHER): Payer: Self-pay | Admitting: *Deleted

## 2021-02-08 ENCOUNTER — Other Ambulatory Visit (INDEPENDENT_AMBULATORY_CARE_PROVIDER_SITE_OTHER): Payer: Self-pay

## 2021-02-08 ENCOUNTER — Encounter (INDEPENDENT_AMBULATORY_CARE_PROVIDER_SITE_OTHER): Payer: Self-pay | Admitting: *Deleted

## 2021-02-08 DIAGNOSIS — Z1211 Encounter for screening for malignant neoplasm of colon: Secondary | ICD-10-CM

## 2021-02-08 MED ORDER — PEG 3350-KCL-NA BICARB-NACL 420 G PO SOLR: 4000.0000 mL | Freq: Once | ORAL | 0 refills | Status: AC

## 2021-02-08 NOTE — Telephone Encounter (Signed)
Done

## 2021-02-08 NOTE — Telephone Encounter (Signed)
Patient needs trilyte 

## 2021-02-21 ENCOUNTER — Telehealth (INDEPENDENT_AMBULATORY_CARE_PROVIDER_SITE_OTHER): Payer: Self-pay | Admitting: *Deleted

## 2021-02-21 NOTE — Telephone Encounter (Signed)
Referring MD/PCP: strader  Procedure: tcs w propofol  Reason/Indication:  screening  Has patient had this procedure before?  no  If so, when, by whom and where?    Is there a family history of colon cancer?  no  Who?  What age when diagnosed?    Is patient diabetic? If yes, Type 1 or Type 2   no      Does patient have prosthetic heart valve or mechanical valve?  no  Do you have a pacemaker/defibrillator?  no  Has patient ever had endocarditis/atrial fibrillation? no  Have you had a stroke/heart attack last 6 mths? no  Does patient use oxygen? no  Has patient had joint replacement within last 12 months?  no  Is patient constipated or do they take laxatives? no  Does patient have a history of alcohol/drug use?  no  Is patient on blood thinner such as Coumadin, Plavix and/or Aspirin? no  Do you take medicine for weight loss?  no  For female patients,: do you still have your menstrual cycle?   Medications: lexapro 10 mg daily  Allergies: shellfish allergies, artifical sweeteners  Medication Adjustment per Dr Rehman/Dr Jenetta Downer   Procedure date & time: 03/21/21

## 2021-03-15 NOTE — Patient Instructions (Signed)
Terri Franklin  03/15/2021     @PREFPERIOPPHARMACY @   Your procedure is scheduled on  03/21/2021.   Report to [facility] at  [facility].   Call this number if you have problems the morning of surgery:  [phone]   Remember:  Follow the diet and prep instructions given to you by the office.    Take these medicines the morning of surgery with A SIP OF WATER      lexapro.     Please brush your teeth.  Do not wear jewelry, make-up or nail polish.  Do not wear lotions, powders, or perfumes, or deodorant.  Do not shave 48 hours prior to surgery.  Men may shave face and neck.  Do not bring valuables to the hospital.  [facility] is not responsible for any belongings or valuables.  Contacts, dentures or bridgework may not be worn into surgery.  Leave your suitcase in the car.  After surgery it may be brought to your room.  For patients admitted to the hospital, discharge time will be determined by your treatment team.  Patients discharged the day of surgery will not be allowed to drive home and must have someone with them for 24 hours.    Special instructions:    DO NOT smoke tobacco or vape for 24 hours before your procedure.  Please read over the following fact sheets that you were given. Anesthesia Post-op Instructions and Care and Recovery After Surgery      Colonoscopy, Adult, Care After This sheet gives you information about how to care for yourself after your procedure. Your health care provider may also give you more specific instructions. If you have problems or questions, contact your health careprovider. What can I expect after the procedure? After the procedure, it is common to have: A small amount of blood in your stool for 24 hours after the procedure. Some gas. Mild cramping or bloating of your abdomen. Follow these instructions at home: Eating and drinking  Drink enough fluid to keep your urine pale yellow. Follow instructions from your health  care provider about eating or drinking restrictions. Resume your normal diet as instructed by your health care provider. Avoid heavy or fried foods that are hard to digest.  Activity Rest as told by your health care provider. Avoid sitting for a long time without moving. Get up to take short walks every 1-2 hours. This is important to improve blood flow and breathing. Ask for help if you feel weak or unsteady. Return to your normal activities as told by your health care provider. Ask your health care provider what activities are safe for you. Managing cramping and bloating  Try walking around when you have cramps or feel bloated. Apply heat to your abdomen as told by your health care provider. Use the heat source that your health care provider recommends, such as a moist heat pack or a heating pad. Place a towel between your skin and the heat source. Leave the heat on for 20-30 minutes. Remove the heat if your skin turns bright red. This is especially important if you are unable to feel pain, heat, or cold. You may have a greater risk of getting burned.  General instructions If you were given a sedative during the procedure, it can affect you for several hours. Do not drive or operate machinery until your health care provider says that it is safe. For the first 24 hours after the procedure: Do  not sign important documents. Do not drink alcohol. Do your regular daily activities at a slower pace than normal. Eat soft foods that are easy to digest. Take over-the-counter and prescription medicines only as told by your health care provider. Keep all follow-up visits as told by your health care provider. This is important. Contact a health care provider if: You have blood in your stool 2-3 days after the procedure. Get help right away if you have: More than a small spotting of blood in your stool. Large blood clots in your stool. Swelling of your abdomen. Nausea or vomiting. A  fever. Increasing pain in your abdomen that is not relieved with medicine. Summary After the procedure, it is common to have a small amount of blood in your stool. You may also have mild cramping and bloating of your abdomen. If you were given a sedative during the procedure, it can affect you for several hours. Do not drive or operate machinery until your health care provider says that it is safe. Get help right away if you have a lot of blood in your stool, nausea or vomiting, a fever, or increased pain in your abdomen. This information is not intended to replace advice given to you by your health care provider. Make sure you discuss any questions you have with your healthcare provider. Document Revised: 09/10/2019 Document Reviewed: 04/12/2019 Elsevier Patient Education  [place] After This sheet gives you information about how to care for yourself after your procedure. Your health care provider may also give you more specific instructions. If you have problems or questions, contact your health careprovider. What can I expect after the procedure? After the procedure, it is common to have: Tiredness. Forgetfulness about what happened after the procedure. Impaired judgment for important decisions. Nausea or vomiting. Some difficulty with balance. Follow these instructions at home: For the time period you were told by your health care provider:     Rest as needed. Do not participate in activities where you could fall or become injured. Do not drive or use machinery. Do not drink alcohol. Do not take sleeping pills or medicines that cause drowsiness. Do not make important decisions or sign legal documents. Do not take care of children on your own. Eating and drinking Follow the diet that is recommended by your health care provider. Drink enough fluid to keep your urine pale yellow. If you vomit: Drink water, juice, or soup when you can drink  without vomiting. Make sure you have little or no nausea before eating solid foods. General instructions Have a responsible adult stay with you for the time you are told. It is important to have someone help care for you until you are awake and alert. Take over-the-counter and prescription medicines only as told by your health care provider. If you have sleep apnea, surgery and certain medicines can increase your risk for breathing problems. Follow instructions from your health care provider about wearing your sleep device: Anytime you are sleeping, including during daytime naps. While taking prescription pain medicines, sleeping medicines, or medicines that make you drowsy. Avoid smoking. Keep all follow-up visits as told by your health care provider. This is important. Contact a health care provider if: You keep feeling nauseous or you keep vomiting. You feel light-headed. You are still sleepy or having trouble with balance after 24 hours. You develop a rash. You have a fever. You have redness or swelling around the IV site. Get help right away if:  You have trouble breathing. You have new-onset confusion at home. Summary For several hours after your procedure, you may feel tired. You may also be forgetful and have poor judgment. Have a responsible adult stay with you for the time you are told. It is important to have someone help care for you until you are awake and alert. Rest as told. Do not drive or operate machinery. Do not drink alcohol or take sleeping pills. Get help right away if you have trouble breathing, or if you suddenly become confused. This information is not intended to replace advice given to you by your health care provider. Make sure you discuss any questions you have with your healthcare provider. Document Revised: 06/01/2020 Document Reviewed: 08/19/2019 Elsevier Patient Education  2022 [employer].

## 2021-03-19 ENCOUNTER — Other Ambulatory Visit: Payer: Self-pay

## 2021-03-19 ENCOUNTER — Other Ambulatory Visit (HOSPITAL_COMMUNITY): Admission: RE | Admit: 2021-03-19 | Payer: BC Managed Care – PPO | Source: Ambulatory Visit

## 2021-03-19 ENCOUNTER — Encounter (HOSPITAL_COMMUNITY): Payer: Self-pay

## 2021-03-19 ENCOUNTER — Encounter (HOSPITAL_COMMUNITY)
Admission: RE | Admit: 2021-03-19 | Discharge: 2021-03-19 | Disposition: A | Discharge status: Home / Self Care | Payer: BC Managed Care – PPO | Source: Ambulatory Visit | Attending: Internal Medicine | Admitting: Internal Medicine

## 2021-03-19 DIAGNOSIS — Z87891 Personal history of nicotine dependence: Secondary | ICD-10-CM | POA: Diagnosis not present

## 2021-03-19 DIAGNOSIS — K644 Residual hemorrhoidal skin tags: Secondary | ICD-10-CM | POA: Diagnosis not present

## 2021-03-19 DIAGNOSIS — Z01812 Encounter for preprocedural laboratory examination: Secondary | ICD-10-CM | POA: Insufficient documentation

## 2021-03-19 DIAGNOSIS — Z1211 Encounter for screening for malignant neoplasm of colon: Secondary | ICD-10-CM

## 2021-03-19 DIAGNOSIS — Z79899 Other long term (current) drug therapy: Secondary | ICD-10-CM | POA: Diagnosis not present

## 2021-03-19 DIAGNOSIS — Z800 Family history of malignant neoplasm of digestive organs: Secondary | ICD-10-CM | POA: Diagnosis not present

## 2021-03-19 DIAGNOSIS — D128 Benign neoplasm of rectum: Secondary | ICD-10-CM | POA: Diagnosis not present

## 2021-03-19 DIAGNOSIS — K6289 Other specified diseases of anus and rectum: Secondary | ICD-10-CM | POA: Diagnosis not present

## 2021-03-19 HISTORY — DX: Anxiety disorder, unspecified: F41.9

## 2021-03-19 LAB — PREGNANCY, URINE: Preg Test, Ur: NEGATIVE

## 2021-03-21 ENCOUNTER — Encounter (HOSPITAL_COMMUNITY)
Admission: RE | Disposition: A | Discharge status: Home / Self Care | Payer: Self-pay | Source: Home / Self Care | Attending: Internal Medicine

## 2021-03-21 ENCOUNTER — Ambulatory Visit (HOSPITAL_COMMUNITY)
Admission: RE | Admit: 2021-03-21 | Discharge: 2021-03-21 | Disposition: A | Discharge status: Home / Self Care | Payer: BC Managed Care – PPO | Attending: Internal Medicine | Admitting: Internal Medicine

## 2021-03-21 ENCOUNTER — Other Ambulatory Visit: Payer: Self-pay

## 2021-03-21 ENCOUNTER — Ambulatory Visit (HOSPITAL_COMMUNITY): Payer: BC Managed Care – PPO | Admitting: Anesthesiology

## 2021-03-21 ENCOUNTER — Encounter (HOSPITAL_COMMUNITY): Payer: Self-pay | Admitting: Internal Medicine

## 2021-03-21 DIAGNOSIS — K644 Residual hemorrhoidal skin tags: Secondary | ICD-10-CM

## 2021-03-21 DIAGNOSIS — Z1211 Encounter for screening for malignant neoplasm of colon: Secondary | ICD-10-CM

## 2021-03-21 DIAGNOSIS — Z79899 Other long term (current) drug therapy: Secondary | ICD-10-CM | POA: Insufficient documentation

## 2021-03-21 DIAGNOSIS — K6289 Other specified diseases of anus and rectum: Secondary | ICD-10-CM | POA: Insufficient documentation

## 2021-03-21 DIAGNOSIS — D128 Benign neoplasm of rectum: Secondary | ICD-10-CM | POA: Insufficient documentation

## 2021-03-21 DIAGNOSIS — Z87891 Personal history of nicotine dependence: Secondary | ICD-10-CM | POA: Insufficient documentation

## 2021-03-21 DIAGNOSIS — Z800 Family history of malignant neoplasm of digestive organs: Secondary | ICD-10-CM | POA: Insufficient documentation

## 2021-03-21 DIAGNOSIS — K621 Rectal polyp: Secondary | ICD-10-CM

## 2021-03-21 HISTORY — PX: COLONOSCOPY WITH PROPOFOL: SHX5780

## 2021-03-21 HISTORY — PX: POLYPECTOMY: SHX5525

## 2021-03-21 LAB — HM COLONOSCOPY

## 2021-03-21 SURGERY — COLONOSCOPY WITH PROPOFOL
Anesthesia: General

## 2021-03-21 MED ORDER — LACTATED RINGERS IV SOLN: INTRAVENOUS | Status: DC

## 2021-03-21 MED ORDER — PROPOFOL 10 MG/ML IV BOLUS
INTRAVENOUS | Status: DC | PRN
  Administered 2021-03-21: 100 mg via INTRAVENOUS

## 2021-03-21 MED ORDER — PROPOFOL 500 MG/50ML IV EMUL
INTRAVENOUS | Status: DC | PRN
  Administered 2021-03-21: 150 ug/kg/min via INTRAVENOUS

## 2021-03-21 MED ORDER — PROPOFOL 10 MG/ML IV BOLUS
INTRAVENOUS | Status: AC
  Filled 2021-03-21: qty 40

## 2021-03-21 NOTE — Discharge Instructions (Signed)
No aspirin or NSAIDs for 1 week Resume usual medications and diet as before. No driving for 24 hours. Physician will call with biopsy results and further recommendations.

## 2021-03-21 NOTE — H&P (Signed)
Terri Franklin is an 50 y.o. female.   Chief Complaint: Patient is here for colonoscopy. HPI: Patient is 50 year old Caucasian female who is here for screening colonoscopy.  She denies abdominal pain change in bowel habits or rectal bleeding.  She has good appetite and her weight has been stable. She does not take aspirin or NSAIDs. Family history is positive for colon carcinoma in paternal uncle in his 71s.  She has no other family members with colon carcinoma or with polyps.  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 Her smoking use included cigarettes. She smoked an average of 0.50 packs per day. She has never used smokeless tobacco. She reports that she does not drink alcohol and does not use drugs.  Allergies:  Allergies  Allergen Reactions   Shellfish Allergy Anaphylaxis   Aspartame And Phenylalanine Other (See Comments)    Migraine    Medications Prior to Admission  Medication Sig Dispense Refill   escitalopram (LEXAPRO) 10 MG tablet Take 10 mg by mouth daily.      No results found for this or any previous visit (from the past 48 hour(s)). No results found.  Review of Systems  Blood pressure 123/71, pulse 64, temperature 97.7 F (36.5 C), temperature source Oral, resp. rate 20, SpO2 100 %. Physical Exam HENT:     Mouth/Throat:     Mouth: Mucous membranes are moist.     Pharynx: Oropharynx is clear.  Eyes:     General: No scleral icterus.    Conjunctiva/sclera: Conjunctivae normal.  Cardiovascular:     Rate and Rhythm: Normal rate and regular rhythm.     Heart sounds: Normal heart sounds. No murmur heard. Pulmonary:     Breath sounds: Normal breath sounds.   Abdominal:     Comments: Abdomen is soft and nontender with organomegaly or masses. Pfannenstiel scar.  Musculoskeletal:        General: No swelling.     Cervical back: Neck supple.  Lymphadenopathy:     Cervical: No cervical adenopathy.  Skin:    General: Skin is warm and dry.  Neurological:     Mental Status: She is alert.     Assessment/Plan  Average risk screening colonoscopy  Hildred Laser, MD 03/21/2021, 10:19 AM

## 2021-03-21 NOTE — Anesthesia Postprocedure Evaluation (Signed)
Anesthesia Post Note  Patient: Terri Franklin  Procedure(s) Performed: COLONOSCOPY WITH PROPOFOL POLYPECTOMY  Anesthesia Type: General Level of consciousness: awake and alert and oriented Pain management: pain level controlled Vital Signs Assessment: post-procedure vital signs reviewed and stable Respiratory status: spontaneous breathing and respiratory function stable Cardiovascular status: blood pressure returned to baseline and stable Postop Assessment: no apparent nausea or vomiting Anesthetic complications: no   No notable events documented.   Last Vitals:  Vitals:   03/21/21 1059 03/21/21 1105  BP: (!) 101/51 106/61  Pulse:    Resp:    Temp:  (!) 36.2 C  SpO2:      Last Pain:  Vitals:   03/21/21 1050  TempSrc: Oral  PainSc: 0-No pain                 Donnette Macmullen C Mattthew Ziomek

## 2021-03-21 NOTE — Anesthesia Preprocedure Evaluation (Addendum)
Anesthesia Evaluation  Patient identified by MRN, date of birth, ID band Patient awake    Reviewed: Allergy & Precautions, NPO status , Patient's Chart, lab work & pertinent test results  History of Anesthesia Complications Negative for: history of anesthetic complications  Airway Mallampati: II  TM Distance: >3 FB Neck ROM: Full    Dental  (+) Dental Advisory Given   Pulmonary former smoker,    Pulmonary exam normal breath sounds clear to auscultation       Cardiovascular Exercise Tolerance: Good Normal cardiovascular exam Rhythm:Regular Rate:Normal     Neuro/Psych Anxiety    GI/Hepatic negative GI ROS, Neg liver ROS,   Endo/Other  diabetes (diet controlled), Well Controlled, Type 2  Renal/GU negative Renal ROS     Musculoskeletal negative musculoskeletal ROS (+)   Abdominal   Peds  Hematology   Anesthesia Other Findings   Reproductive/Obstetrics negative OB ROS                            Anesthesia Physical Anesthesia Plan  ASA: 2  Anesthesia Plan: General   Post-op Pain Management:    Induction: Intravenous  PONV Risk Score and Plan: Propofol infusion  Airway Management Planned: Nasal Cannula and Natural Airway  Additional Equipment:   Intra-op Plan:   Post-operative Plan:   Informed Consent: I have reviewed the patients History and Physical, chart, labs and discussed the procedure including the risks, benefits and alternatives for the proposed anesthesia with the patient or authorized representative who has indicated his/her understanding and acceptance.     Dental advisory given  Plan Discussed with: CRNA and Surgeon  Anesthesia Plan Comments:         Anesthesia Quick Evaluation

## 2021-03-21 NOTE — Op Note (Signed)
[facility] Patient Name: Terri Franklin Procedure Date: 03/21/2021 10:08 AM MRN: [id number] Date of Birth: [birth year] Attending MD: Hildred Laser , MD CSN: [id number] Age: 50 Admit Type: Outpatient Procedure:                Colonoscopy Indications:              Screening for colorectal malignant neoplasm Providers:                Hildred Laser, MD, Gwenlyn Fudge, RN, Crystal Page Referring MD:             Renee Rival, FNP Medicines:                Propofol per Anesthesia Complications:            No immediate complications. Estimated Blood Loss:     Estimated blood loss: none. Procedure:                Pre-Anesthesia Assessment:                           - Prior to the procedure, a History and Physical                            was performed, and patient medications and                            allergies were reviewed. The patient's tolerance of                            previous anesthesia was also reviewed. The risks                            and benefits of the procedure and the sedation                            options and risks were discussed with the patient.                            All questions were answered, and informed consent                            was obtained. Prior Anticoagulants: The patient has                            taken no previous anticoagulant or antiplatelet                            agents. ASA Grade Assessment: II - A patient with                            mild systemic disease. After reviewing the risks                            and benefits, the patient was deemed in  satisfactory condition to undergo the procedure.                           After obtaining informed consent, the colonoscope                            was passed under direct vision. Throughout the                            procedure, the patient's blood pressure, pulse, and                            oxygen saturations were monitored  continuously. The                            [id number]) was introduced through the anus                            and advanced to the the cecum, identified by                            appendiceal orifice and ileocecal valve. The                            colonoscopy was performed without difficulty. The                            patient tolerated the procedure well. The quality                            of the bowel preparation was good. The ileocecal                            valve, appendiceal orifice, and rectum were                            photographed. Scope In: 10:25:35 AM Scope Out: 10:42:14 AM Scope Withdrawal Time: 0 hours 10 minutes 50 seconds  Total Procedure Duration: 0 hours 16 minutes 39 seconds  Findings:      The perianal and digital rectal examinations were normal.      A 15 mm polyp was found in the rectum. The polyp was {skip} Central       portion was polypoidal in the peripheral part was flat. Area was       successfully injected with 4 mL Eleview for lesion assessment, and this       injection appeared to lift the lesion adequately. The polyp was removed       with a hot snare. Resection and retrieval were complete. The pathology       specimen was placed into Bottle Number 1.      The exam was otherwise normal throughout the examined colon.      External hemorrhoids were found during retroflexion. The hemorrhoids       were small.      Anal papilla(e) were hypertrophied. Impression:               -  One 15 mm polyp in the rectum, removed with a hot                            snare. Resected and retrieved. Injected.                           - External hemorrhoids.                           - Anal papilla(e) were hypertrophied. Moderate Sedation:      Per Anesthesia Care Recommendation:           - Patient has a contact number available for                            emergencies. The signs and symptoms of potential                             delayed complications were discussed with the                            patient. Return to normal activities tomorrow.                            Written discharge instructions were provided to the                            patient.                           - Resume previous diet today.                           - Continue present medications.                           - No aspirin, ibuprofen, naproxen, or other                            non-steroidal anti-inflammatory drugs for 7 days.                           - Await pathology results.                           - Repeat colonoscopy is recommended. The                            colonoscopy date will be determined after pathology                            results from today's exam become available for                            review. Procedure Code(s):        --- Professional ---  45385, Colonoscopy, flexible; with removal of                            tumor(s), polyp(s), or other lesion(s) by snare                            technique                           45381, Colonoscopy, flexible; with directed                            submucosal injection(s), any substance Diagnosis Code(s):        --- Professional ---                           Z12.11, Encounter for screening for malignant                            neoplasm of colon                           K62.1, Rectal polyp                           K62.89, Other specified diseases of anus and rectum                           K64.4, Residual hemorrhoidal skin tags CPT copyright 2019 American Medical Association. All rights reserved. The codes documented in this report are preliminary and upon coder review may  be revised to meet current compliance requirements. Hildred Laser, MD Hildred Laser, MD 03/21/2021 10:51:07 AM This report has been signed electronically. Number of Addenda: 0

## 2021-03-21 NOTE — Transfer of Care (Signed)
Immediate Anesthesia Transfer of Care Note  Patient: Terri Franklin  Procedure(s) Performed: COLONOSCOPY WITH PROPOFOL POLYPECTOMY  Patient Location: Short Stay  Anesthesia Type:General  Level of Consciousness: awake, alert  and oriented  Airway & Oxygen Therapy: Patient Spontanous Breathing  Post-op Assessment: Report given to RN and Post -op Vital signs reviewed and stable  Post vital signs: Reviewed and stable  Last Vitals:  Vitals Value Taken Time  BP    Temp    Pulse 59 03/21/21 1050  Resp 18 03/21/21 1050  SpO2 99 % 03/21/21 1050    Last Pain:  Vitals:   03/21/21 1050  TempSrc: Oral  PainSc: 0-No pain      Patients Stated Pain Goal: 8 (94/12/90 4753)  Complications: No notable events documented.

## 2021-03-22 LAB — SURGICAL PATHOLOGY

## 2021-03-22 LAB — GLUCOSE, CAPILLARY: Glucose-Capillary: 91 mg/dL (ref 70–99)

## 2021-03-23 ENCOUNTER — Encounter (INDEPENDENT_AMBULATORY_CARE_PROVIDER_SITE_OTHER): Payer: Self-pay | Admitting: *Deleted

## 2021-04-05 ENCOUNTER — Encounter (HOSPITAL_COMMUNITY): Payer: Self-pay | Admitting: Internal Medicine

## 2021-05-24 ENCOUNTER — Encounter (INDEPENDENT_AMBULATORY_CARE_PROVIDER_SITE_OTHER): Payer: Self-pay
